# Patient Record
Sex: Female | Born: 1945 | Race: White | Hispanic: No | Marital: Single | Smoking: Never smoker
Health system: Southern US, Community
[De-identification: ages and names within clinical notes are randomized; demographics above are authoritative.]

## PROBLEM LIST (undated history)

## (undated) DIAGNOSIS — Z87442 Personal history of urinary calculi: Secondary | ICD-10-CM

## (undated) DIAGNOSIS — C50111 Malignant neoplasm of central portion of right female breast: Secondary | ICD-10-CM

## (undated) DIAGNOSIS — R51 Headache: Secondary | ICD-10-CM

## (undated) DIAGNOSIS — G8929 Other chronic pain: Secondary | ICD-10-CM

## (undated) DIAGNOSIS — I10 Essential (primary) hypertension: Secondary | ICD-10-CM

## (undated) DIAGNOSIS — R928 Other abnormal and inconclusive findings on diagnostic imaging of breast: Secondary | ICD-10-CM

## (undated) DIAGNOSIS — G43909 Migraine, unspecified, not intractable, without status migrainosus: Secondary | ICD-10-CM

## (undated) DIAGNOSIS — M1990 Unspecified osteoarthritis, unspecified site: Secondary | ICD-10-CM

## (undated) DIAGNOSIS — R519 Headache, unspecified: Secondary | ICD-10-CM

## (undated) DIAGNOSIS — K219 Gastro-esophageal reflux disease without esophagitis: Secondary | ICD-10-CM

## (undated) HISTORY — PX: CATARACT EXTRACTION W/ INTRAOCULAR LENS  IMPLANT, BILATERAL: SHX1307

---

## 2018-07-21 ENCOUNTER — Other Ambulatory Visit: Payer: Self-pay | Admitting: Family Medicine

## 2018-07-21 DIAGNOSIS — N6489 Other specified disorders of breast: Secondary | ICD-10-CM

## 2018-07-21 HISTORY — PX: BREAST BIOPSY: SHX20

## 2018-07-23 ENCOUNTER — Ambulatory Visit
Admission: RE | Admit: 2018-07-23 | Discharge: 2018-07-23 | Disposition: A | Discharge status: Home / Self Care | Payer: Medicare Other | Source: Ambulatory Visit | Attending: Family Medicine | Admitting: Family Medicine

## 2018-07-23 DIAGNOSIS — N6489 Other specified disorders of breast: Secondary | ICD-10-CM

## 2018-07-23 HISTORY — PX: BREAST BIOPSY: SHX20

## 2018-08-05 ENCOUNTER — Other Ambulatory Visit: Payer: Self-pay | Admitting: General Surgery

## 2018-08-05 DIAGNOSIS — C50111 Malignant neoplasm of central portion of right female breast: Secondary | ICD-10-CM

## 2018-08-05 DIAGNOSIS — Z170 Estrogen receptor positive status [ER+]: Principal | ICD-10-CM

## 2018-08-06 NOTE — Pre-Procedure Instructions (Addendum)
Brittany Carr  08/06/2018     No Pharmacies Listed   Your procedure is scheduled on Monday October 7th.  Report to [facility] Admitting at 830 A.M.  Call this number if you have problems the morning of surgery:  [phone]   Remember:  Do not eat or drink after midnight.    Take these medicines the morning of surgery with A SIP OF WATER  Tylenol (if needed)   Protonix   Eye Drops    7 days prior to surgery STOP taking any Aspirin(unless otherwise instructed by your surgeon), Aleve, Naproxen, Ibuprofen, Motrin, Advil, Goody's, BC's, all herbal medications, fish oil, and all vitamins    Do not wear jewelry, make-up or nail polish.  Do not wear lotions, powders, or perfumes, or deodorant.  Do not shave 48 hours prior to surgery.  Men may shave face and neck.  Do not bring valuables to the hospital.  [facility] is not responsible for any belongings or valuables.  Contacts, dentures or bridgework may not be worn into surgery.  Leave your suitcase in the car.  After surgery it may be brought to your room.  For patients admitted to the hospital, discharge time will be determined by your treatment team.  Patients discharged the day of surgery will not be allowed to drive home.    - Preparing For Surgery  Before surgery, you can play an important role. Because skin is not sterile, your skin needs to be as free of germs as possible. You can reduce the number of germs on your skin by washing with CHG (chlorahexidine gluconate) Soap before surgery.  CHG is an antiseptic cleaner which kills germs and bonds with the skin to continue killing germs even after washing.    Oral Hygiene is also important to reduce your risk of infection.  Remember - BRUSH YOUR TEETH THE MORNING OF SURGERY WITH YOUR REGULAR TOOTHPASTE  Please do not use if you have an allergy to CHG or antibacterial soaps. If your skin becomes reddened/irritated stop using the CHG.  Do not shave  (including legs and underarms) for at least 48 hours prior to first CHG shower. It is OK to shave your face.  Please follow these instructions carefully.   1. Shower the NIGHT BEFORE SURGERY and the MORNING OF SURGERY with CHG.   2. If you chose to wash your hair, wash your hair first as usual with your normal shampoo.  3. After you shampoo, rinse your hair and body thoroughly to remove the shampoo.  4. Use CHG as you would any other liquid soap. You can apply CHG directly to the skin and wash gently with a scrungie or a clean washcloth.   5. Apply the CHG Soap to your body ONLY FROM THE NECK DOWN.  Do not use on open wounds or open sores. Avoid contact with your eyes, ears, mouth and genitals (private parts). Wash Face and genitals (private parts)  with your normal soap.  6. Wash thoroughly, paying special attention to the area where your surgery will be performed.  7. Thoroughly rinse your body with warm water from the neck down.  8. DO NOT shower/wash with your normal soap after using and rinsing off the CHG Soap.  9. Pat yourself dry with a CLEAN TOWEL.  10. Wear CLEAN PAJAMAS to bed the night before surgery, wear comfortable clothes the morning of surgery  11. Place CLEAN SHEETS on your bed the night of your first  shower and DO NOT SLEEP WITH PETS.    Day of Surgery:  Do not apply any deodorants/lotions.  Please wear clean clothes to the hospital/surgery center.   Remember to brush your teeth WITH YOUR REGULAR TOOTHPASTE.    Please read over the following fact sheets that you were given.

## 2018-08-09 ENCOUNTER — Encounter (HOSPITAL_COMMUNITY)
Admission: RE | Admit: 2018-08-09 | Discharge: 2018-08-09 | Disposition: A | Discharge status: Home / Self Care | Payer: Medicare Other | Source: Ambulatory Visit | Attending: General Surgery | Admitting: General Surgery

## 2018-08-09 ENCOUNTER — Encounter (HOSPITAL_COMMUNITY): Payer: Self-pay

## 2018-08-09 ENCOUNTER — Other Ambulatory Visit: Payer: Self-pay

## 2018-08-09 DIAGNOSIS — Z01818 Encounter for other preprocedural examination: Secondary | ICD-10-CM | POA: Insufficient documentation

## 2018-08-09 DIAGNOSIS — I10 Essential (primary) hypertension: Secondary | ICD-10-CM | POA: Insufficient documentation

## 2018-08-09 HISTORY — DX: Unspecified osteoarthritis, unspecified site: M19.90

## 2018-08-09 HISTORY — DX: Personal history of urinary calculi: Z87.442

## 2018-08-09 HISTORY — DX: Gastro-esophageal reflux disease without esophagitis: K21.9

## 2018-08-09 HISTORY — DX: Essential (primary) hypertension: I10

## 2018-08-09 LAB — CBC WITH DIFFERENTIAL/PLATELET
Abs Immature Granulocytes: 0 10*3/uL (ref 0.0–0.1)
Basophils Absolute: 0 10*3/uL (ref 0.0–0.1)
Basophils Relative: 0 %
EOS ABS: 0.4 10*3/uL (ref 0.0–0.7)
Eosinophils Relative: 5 %
HEMATOCRIT: 40.7 % (ref 36.0–46.0)
HEMOGLOBIN: 13.3 g/dL (ref 12.0–15.0)
IMMATURE GRANULOCYTES: 0 %
LYMPHS ABS: 1.5 10*3/uL (ref 0.7–4.0)
LYMPHS PCT: 18 %
MCH: 31 pg (ref 26.0–34.0)
MCHC: 32.7 g/dL (ref 30.0–36.0)
MCV: 94.9 fL (ref 78.0–100.0)
Monocytes Absolute: 0.7 10*3/uL (ref 0.1–1.0)
Monocytes Relative: 8 %
NEUTROS PCT: 69 %
Neutro Abs: 5.6 10*3/uL (ref 1.7–7.7)
Platelets: 368 10*3/uL (ref 150–400)
RBC: 4.29 MIL/uL (ref 3.87–5.11)
RDW: 12.7 % (ref 11.5–15.5)
WBC: 8.2 10*3/uL (ref 4.0–10.5)

## 2018-08-09 LAB — COMPREHENSIVE METABOLIC PANEL
ALBUMIN: 3.9 g/dL (ref 3.5–5.0)
ALK PHOS: 84 U/L (ref 38–126)
ALT: 32 U/L (ref 0–44)
AST: 25 U/L (ref 15–41)
Anion gap: 8 (ref 5–15)
BILIRUBIN TOTAL: 0.5 mg/dL (ref 0.3–1.2)
BUN: 18 mg/dL (ref 8–23)
CALCIUM: 9.8 mg/dL (ref 8.9–10.3)
CO2: 25 mmol/L (ref 22–32)
CREATININE: 0.79 mg/dL (ref 0.44–1.00)
Chloride: 106 mmol/L (ref 98–111)
GFR calc Af Amer: >60 mL/min (ref >60)
GFR calc non Af Amer: >60 mL/min (ref >60)
GLUCOSE: 93 mg/dL (ref 70–99)
Potassium: 4.1 mmol/L (ref 3.5–5.1)
Sodium: 139 mmol/L (ref 135–145)
TOTAL PROTEIN: 7.5 g/dL (ref 6.5–8.1)

## 2018-08-09 NOTE — Progress Notes (Signed)
PCP - Chapman Fitch MD  EKG - 08/09/18   Aspirin Instructions: N/A  Anesthesia review: none  Patient denies shortness of breath, fever, cough and chest pain at PAT appointment   Patient verbalized understanding of instructions that were given to them at the PAT appointment. Patient was also instructed that they will need to review over the PAT instructions again at home before surgery.

## 2018-08-11 NOTE — H&P (Signed)
Brittany Carr Location: [facility] Patient #: [id number] DOB: 07/07/1946 Single / Language: Brittany Carr / Race: [place] female        History of Present Illness      This is a 72 year old female from [place]. Referred by Dr. 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 Her primary care physician is Dr. Purvis Carr.      She has no prior breast problems. She felt a lump in her right breast for at least 2 months. No nipple discharge. Imaging studies show a large mass in the right breast. Mass plus calcifications are measured as 4.3 cm. A lymph node in the right axilla was felt to be abnormal. Small density was noted in the upper outer quadrant of the left breast. 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 Biopsy of the right axillary lymph node showed no lymphoid tissue. Benign. This was felt to be discordant. A clip was left in this lymph node. Biopsy of the left breast upper outer quadrant architectural distortion shows complex grossly lesion. Excision recommended Case was discussed in breast conference yesterday. It was felt likely that she would need a mastectomy whether we did neoadjuvant chemotherapy or not.     The patient is not interested in neoadjuvant chemotherapy. She was to go ahead with right mastectomy without reconstruction, and appropriate procedures. She agrees to refer to medical oncology and radiation oncology but would like to expedite her care. She understands Port-A-Cath insertion but does not that done right now      Comorbidities include hypertension and GERD but basically healthy. Family history negative for breast or ovarian cancer. Brother died had diabetes and agent orange. Mother died with diabetes. Father died of a stroke Social history reveals that she is single. Never married. No children. Really no social  support. Denies tobacco or alcohol. Has a 32 year old niece in town. Brother lives at [place] in the [place]. Retired from [employer] and working for [employer]. She asked if she could drive herself home from the hospital following mastectomy and I told her that was not a good idea      We had a long talk. In the a.m. she wants to go ahead with right mastectomy and I think we can do that She also agrees to concurrent referral to medical oncology and radiation oncology and we have initiated that urgently.      She will be scheduled for injection blue dye right breast, right total mastectomy, targeted axillary dissection, left breast lumpectomy with radioactive seed localization I discussed the indications, details, techniques, and numerous risk of the surgery with her. She is aware the risks of bleeding, infection, cosmetic deformity, further surgery if she has multiple positive nodes on the right or if we find invasive cancer on the left. Arm swelling. Shoulder disability. May need physical therapy. Arm numbness. Skin necrosis. She understands all these issues with well. All questions were answered. She agrees with this plan   She will need case management and social work consult to help her with transportation and home health nursing.   Past Surgical History  Breast Biopsy  Bilateral. Cataract Surgery  Bilateral. Oral Surgery   Diagnostic studies Colonoscopy  never Mammogram  within last year Pap Smear  never  Allergies Sulfa Antibiotics   Medication History  Lisinopril (20MG Tablet, Oral) Active. Pantoprazole Sodium (20MG Tablet DR, Oral)  Active. Vitamin D (Cholecalciferol) (1000UNIT Capsule, Oral) Active. One-A-Day Proactive 65+ (Oral) Active. Osteo Complex (Oral) Active. Medications Reconciled  Social History  Caffeine use  Carbonated beverages, Tea. No alcohol use  No drug use  Tobacco use  Never smoker.  Jerauld Arthritis   Mother. Breast Cancer  Family Members In General. Cerebrovascular Accident  Father. Depression  Mother. Diabetes Mellitus  Brother, Father, Mother. Hypertension  Brother, Father, Mother.  Pregnancy / Birth History  Age at menarche  15 years. Age of menopause  75-55 Gravida  0 Para  0  Other Problems  Anxiety Disorder  Arthritis  Back Pain  Bladder Problems  Breast Cancer  Cholelithiasis  Chronic Obstructive Lung Disease  Diabetes Mellitus  Emphysema Of Lung  Gastroesophageal Reflux Disease  High blood pressure  Lump In Breast  Myocardial infarction    ROS General Not Present- Appetite Loss, Chills, Fatigue, Fever, Night Sweats, Weight Gain and Weight Loss. Skin Not Present- Change in Wart/Mole, Dryness, Hives, Jaundice, New Lesions, Non-Healing Wounds, Rash and Ulcer. HEENT Present- Seasonal Allergies and Wears glasses/contact lenses. Not Present- Earache, Hearing Loss, Hoarseness, Nose Bleed, Oral Ulcers, Ringing in the Ears, Sinus Pain, Sore Throat, Visual Disturbances and Yellow Eyes. Respiratory Not Present- Bloody sputum, Chronic Cough, Difficulty Breathing, Snoring and Wheezing. Breast Present- Breast Mass and Breast Pain. Not Present- Nipple Discharge and Skin Changes. Cardiovascular Not Present- Chest Pain, Difficulty Breathing Lying Down, Leg Cramps, Palpitations, Rapid Heart Rate, Shortness of Breath and Swelling of Extremities. Gastrointestinal Not Present- Abdominal Pain, Bloating, Bloody Stool, Change in Bowel Habits, Chronic diarrhea, Constipation, Difficulty Swallowing, Excessive gas, Gets full quickly at meals, Hemorrhoids, Indigestion, Nausea, Rectal Pain and Vomiting. Female Genitourinary Not Present- Frequency, Nocturia, Painful Urination, Pelvic Pain and Urgency. Musculoskeletal Present- Joint Stiffness. Not Present- Back Pain, Joint Pain, Muscle Pain, Muscle Weakness and Swelling of Extremities. Neurological Not Present- Decreased  Memory, Fainting, Headaches, Numbness, Seizures, Tingling, Tremor, Trouble walking and Weakness. Psychiatric Not Present- Anxiety, Bipolar, Change in Sleep Pattern, Depression, Fearful and Frequent crying. Endocrine Not Present- Cold Intolerance, Excessive Hunger, Hair Changes, Heat Intolerance, Hot flashes and New Diabetes. Hematology Not Present- Blood Thinners, Easy Bruising, Excessive bleeding, Gland problems, HIV and Persistent Infections.  Vitals  Weight: 174.13 lb Height: 68in Body Surface Area: 1.93 m Body Mass Index: 26.48 kg/m  Temp.: 13F(Oral)  Pulse: 66 (Regular)  BP: 160/88 (Sitting, Left Arm, Standard)     Physical Exam General Mental Status-Alert. General Appearance-Consistent with stated age. Hydration-Well hydrated. Voice-Normal.  Head and Neck Head-normocephalic, atraumatic with no lesions or palpable masses. Trachea-midline. Thyroid Gland Characteristics - normal size and consistency.  Eye Eyeball - Bilateral-Extraocular movements intact. Sclera/Conjunctiva - Bilateral-No scleral icterus.  Chest and Lung Exam Chest and lung exam reveals -quiet, even and easy respiratory effort with no use of accessory muscles and on auscultation, normal breath sounds, no adventitious sounds and normal vocal resonance. Inspection Chest Wall - Normal. Back - normal.  Breast Breast - Left-Symmetric, Non Tender, No Biopsy scars, no Dimpling, No Inflammation, No Lumpectomy scars, No Mastectomy scars, No Peau d' Orange.  Note: Right breast reveals a large central mass.    6 cm by palpation. Mobile not fixed to chest wall. Skin nipple and areola are healthy. Don't feel any axillary mass on the right. Small area of ecchymoses and maybe small hematoma left breast upper outer quadrant. No cervical or supraclavicular adenopathy.   Cardiovascular Cardiovascular examination reveals -normal heart sounds, regular rate and rhythm with no murmurs and  normal  pedal pulses bilaterally.  Abdomen Inspection Inspection of the abdomen reveals - No Hernias. Skin - Scar - no surgical scars. Palpation/Percussion Palpation and Percussion of the abdomen reveal - Soft, Non Tender, No Rebound tenderness, No Rigidity (guarding) and No hepatosplenomegaly. Auscultation Auscultation of the abdomen reveals - Bowel sounds normal.  Neurologic Neurologic evaluation reveals -alert and oriented x 3 with no impairment of recent or remote memory. Mental Status-Normal.  Musculoskeletal Normal Exam - Left-Upper Extremity Strength Normal and Lower Extremity Strength Normal. Normal Exam - Right-Upper Extremity Strength Normal and Lower Extremity Strength Normal.  Lymphatic Head & Neck  General Head & Neck Lymphatics: Bilateral - Description - Normal. Axillary  General Axillary Region: Bilateral - Description - Normal. Tenderness - Non Tender. Femoral & Inguinal  Generalized Femoral & Inguinal Lymphatics: Bilateral - Description - Normal. Tenderness - Non Tender.    Assessment & Plan  CANCER OF CENTRAL PORTION OF RIGHT BREAST (C50.111)   You recently felt a large lump in your right breast You have had several x-rays and several biopsies In the right breast centrally you have a large cancer, at least 5-6 cm by exam. Pathology shows invasive ductal carcinoma, high-grade, estrogen and progesterone receptor positive, HER-2 negative In the right axilla an abnormal lymph node was biopsied but did not reveal any lymph node tissue so this is discordant and does not correlate. This lymph node will need to be removed, and a few other lymph nodes should be removed In the left breast there was a area of distortion that was biopsied. This showed a complex sclerosing lesion. Probably this is not cancer but there is somewhere between 4 and 9% risk that there is cancer there right now. This area will need to be removed  you stated that you are not  interested in lumpectomy. you would like to proceed with right mastectomy without reconstruction. you stated that you are not sure whether you would agree to chemotherapy or not you stated that you would like to go ahead and expedite your care  Today we decided to go ahead and schedule your surgery and to refer you to medical oncology and radiation oncology here in town If, decisions were made to give radiation therapy or chemotherapy, this could probably be done in [place]  We will expedite your care   ABNORMAL MAMMOGRAM OF LEFT BREAST (R92.8) Impression: Image guided biopsy shows complex sclerosing lesion. Excision recommended HYPERTENSION, ESSENTIAL, BENIGN (I10) CHRONIC GERD (K21.9)    Elora Wolter M. Dalbert Batman, M.D., [facility] General and Minimally invasive Surgery Breast and Colorectal Surgery Office:   [phone] Pager:   [phone]

## 2018-08-12 ENCOUNTER — Ambulatory Visit
Admission: RE | Admit: 2018-08-12 | Discharge: 2018-08-12 | Disposition: A | Discharge status: Home / Self Care | Payer: Medicare Other | Source: Ambulatory Visit | Attending: General Surgery | Admitting: General Surgery

## 2018-08-12 ENCOUNTER — Other Ambulatory Visit: Payer: Self-pay | Admitting: General Surgery

## 2018-08-12 DIAGNOSIS — C50111 Malignant neoplasm of central portion of right female breast: Secondary | ICD-10-CM

## 2018-08-12 DIAGNOSIS — Z170 Estrogen receptor positive status [ER+]: Principal | ICD-10-CM

## 2018-08-16 ENCOUNTER — Ambulatory Visit (HOSPITAL_COMMUNITY)
Admission: RE | Admit: 2018-08-16 | Discharge: 2018-08-17 | Disposition: A | Discharge status: Home / Self Care | Payer: Medicare Other | Source: Ambulatory Visit | Attending: General Surgery | Admitting: General Surgery

## 2018-08-16 ENCOUNTER — Ambulatory Visit (HOSPITAL_COMMUNITY)
Admission: RE | Admit: 2018-08-16 | Discharge: 2018-08-16 | Disposition: A | Discharge status: Home / Self Care | Payer: Medicare Other | Source: Ambulatory Visit | Attending: General Surgery | Admitting: General Surgery

## 2018-08-16 ENCOUNTER — Other Ambulatory Visit: Payer: Self-pay

## 2018-08-16 ENCOUNTER — Encounter (HOSPITAL_COMMUNITY)
Admission: RE | Disposition: A | Discharge status: Home / Self Care | Payer: Self-pay | Source: Ambulatory Visit | Attending: General Surgery

## 2018-08-16 ENCOUNTER — Ambulatory Visit
Admission: RE | Admit: 2018-08-16 | Discharge: 2018-08-16 | Disposition: A | Discharge status: Home / Self Care | Payer: Medicare Other | Source: Ambulatory Visit | Attending: General Surgery | Admitting: General Surgery

## 2018-08-16 ENCOUNTER — Ambulatory Visit (HOSPITAL_COMMUNITY): Payer: Medicare Other | Admitting: Anesthesiology

## 2018-08-16 ENCOUNTER — Encounter (HOSPITAL_COMMUNITY): Payer: Self-pay

## 2018-08-16 DIAGNOSIS — Z170 Estrogen receptor positive status [ER+]: Secondary | ICD-10-CM | POA: Diagnosis not present

## 2018-08-16 DIAGNOSIS — C50111 Malignant neoplasm of central portion of right female breast: Secondary | ICD-10-CM

## 2018-08-16 DIAGNOSIS — K219 Gastro-esophageal reflux disease without esophagitis: Secondary | ICD-10-CM | POA: Insufficient documentation

## 2018-08-16 DIAGNOSIS — I10 Essential (primary) hypertension: Secondary | ICD-10-CM | POA: Diagnosis not present

## 2018-08-16 DIAGNOSIS — C773 Secondary and unspecified malignant neoplasm of axilla and upper limb lymph nodes: Secondary | ICD-10-CM | POA: Insufficient documentation

## 2018-08-16 DIAGNOSIS — Z803 Family history of malignant neoplasm of breast: Secondary | ICD-10-CM | POA: Diagnosis not present

## 2018-08-16 DIAGNOSIS — N6489 Other specified disorders of breast: Secondary | ICD-10-CM | POA: Diagnosis not present

## 2018-08-16 DIAGNOSIS — E119 Type 2 diabetes mellitus without complications: Secondary | ICD-10-CM | POA: Diagnosis not present

## 2018-08-16 DIAGNOSIS — J439 Emphysema, unspecified: Secondary | ICD-10-CM | POA: Diagnosis not present

## 2018-08-16 DIAGNOSIS — N6022 Fibroadenosis of left breast: Secondary | ICD-10-CM | POA: Diagnosis not present

## 2018-08-16 DIAGNOSIS — C50112 Malignant neoplasm of central portion of left female breast: Secondary | ICD-10-CM | POA: Diagnosis present

## 2018-08-16 DIAGNOSIS — Z79899 Other long term (current) drug therapy: Secondary | ICD-10-CM | POA: Insufficient documentation

## 2018-08-16 DIAGNOSIS — R928 Other abnormal and inconclusive findings on diagnostic imaging of breast: Secondary | ICD-10-CM

## 2018-08-16 DIAGNOSIS — I252 Old myocardial infarction: Secondary | ICD-10-CM | POA: Diagnosis not present

## 2018-08-16 HISTORY — DX: Malignant neoplasm of central portion of right female breast: C50.111

## 2018-08-16 HISTORY — PX: MASTECTOMY WITH AXILLARY LYMPH NODE DISSECTION: SHX5661

## 2018-08-16 HISTORY — DX: Other abnormal and inconclusive findings on diagnostic imaging of breast: R92.8

## 2018-08-16 HISTORY — DX: Migraine, unspecified, not intractable, without status migrainosus: G43.909

## 2018-08-16 HISTORY — DX: Other chronic pain: G89.29

## 2018-08-16 HISTORY — PX: BREAST LUMPECTOMY WITH RADIOACTIVE SEED LOCALIZATION: SHX6424

## 2018-08-16 HISTORY — PX: MASTECTOMY: SHX3

## 2018-08-16 HISTORY — DX: Headache: R51

## 2018-08-16 HISTORY — PX: BREAST LUMPECTOMY: SHX2

## 2018-08-16 HISTORY — DX: Headache, unspecified: R51.9

## 2018-08-16 LAB — CBC
HEMATOCRIT: 40.9 % (ref 36.0–46.0)
HEMOGLOBIN: 13.4 g/dL (ref 12.0–15.0)
MCH: 31 pg (ref 26.0–34.0)
MCHC: 32.8 g/dL (ref 30.0–36.0)
MCV: 94.7 fL (ref 78.0–100.0)
Platelets: 299 10*3/uL (ref 150–400)
RBC: 4.32 MIL/uL (ref 3.87–5.11)
RDW: 12.9 % (ref 11.5–15.5)
WBC: 11.3 10*3/uL — ABNORMAL HIGH (ref 4.0–10.5)

## 2018-08-16 LAB — CREATININE, SERUM
CREATININE: 0.82 mg/dL (ref 0.44–1.00)
GFR calc Af Amer: >60 mL/min (ref >60)
GFR calc non Af Amer: >60 mL/min (ref >60)

## 2018-08-16 SURGERY — MASTECTOMY WITH AXILLARY LYMPH NODE DISSECTION
Anesthesia: General | Site: Breast | Laterality: Right

## 2018-08-16 MED ORDER — PHENYLEPHRINE 40 MCG/ML (10ML) SYRINGE FOR IV PUSH (FOR BLOOD PRESSURE SUPPORT)
PREFILLED_SYRINGE | INTRAVENOUS | Status: DC | PRN
  Administered 2018-08-16: 40 ug via INTRAVENOUS
  Administered 2018-08-16 (×3): 80 ug via INTRAVENOUS

## 2018-08-16 MED ORDER — ACETAMINOPHEN 500 MG PO TABS: 1000.0000 mg | ORAL_TABLET | ORAL | Status: DC

## 2018-08-16 MED ORDER — ONDANSETRON HCL 4 MG/2ML IJ SOLN
INTRAMUSCULAR | Status: AC
  Filled 2018-08-16: qty 2

## 2018-08-16 MED ORDER — ONDANSETRON 4 MG PO TBDP: 4.0000 mg | ORAL_TABLET | Freq: Four times a day (QID) | ORAL | Status: DC | PRN

## 2018-08-16 MED ORDER — MEPERIDINE HCL 50 MG/ML IJ SOLN: 6.2500 mg | INTRAMUSCULAR | Status: DC | PRN

## 2018-08-16 MED ORDER — SODIUM CHLORIDE 0.9 % IJ SOLN
INTRAMUSCULAR | Status: AC
  Filled 2018-08-16: qty 10

## 2018-08-16 MED ORDER — PHENYLEPHRINE 40 MCG/ML (10ML) SYRINGE FOR IV PUSH (FOR BLOOD PRESSURE SUPPORT)
PREFILLED_SYRINGE | INTRAVENOUS | Status: AC
  Filled 2018-08-16: qty 10

## 2018-08-16 MED ORDER — BUPIVACAINE-EPINEPHRINE (PF) 0.25% -1:200000 IJ SOLN
INTRAMUSCULAR | Status: AC
  Filled 2018-08-16: qty 30

## 2018-08-16 MED ORDER — CALCIUM POLYCARBOPHIL 625 MG PO TABS
625.0000 mg | ORAL_TABLET | Freq: Every day | ORAL | Status: DC
  Administered 2018-08-16 – 2018-08-17 (×2): 625 mg via ORAL
  Filled 2018-08-16 (×3): qty 1

## 2018-08-16 MED ORDER — FENTANYL CITRATE (PF) 100 MCG/2ML IJ SOLN
50.0000 ug | Freq: Once | INTRAMUSCULAR | Status: AC
  Administered 2018-08-16: 50 ug via INTRAVENOUS

## 2018-08-16 MED ORDER — OSTEO BI-FLEX TRIPLE STRENGTH PO TABS: ORAL_TABLET | Freq: Every day | ORAL | Status: DC

## 2018-08-16 MED ORDER — METHOCARBAMOL 500 MG PO TABS
500.0000 mg | ORAL_TABLET | Freq: Four times a day (QID) | ORAL | Status: DC | PRN
  Administered 2018-08-16: 500 mg via ORAL
  Filled 2018-08-16: qty 1

## 2018-08-16 MED ORDER — GABAPENTIN 300 MG PO CAPS
300.0000 mg | ORAL_CAPSULE | ORAL | Status: AC
  Administered 2018-08-16: 300 mg via ORAL
  Filled 2018-08-16: qty 1

## 2018-08-16 MED ORDER — LISINOPRIL 20 MG PO TABS
20.0000 mg | ORAL_TABLET | Freq: Every day | ORAL | Status: DC
  Administered 2018-08-16 – 2018-08-17 (×2): 20 mg via ORAL
  Filled 2018-08-16 (×2): qty 1

## 2018-08-16 MED ORDER — SODIUM CHLORIDE 0.9 % IJ SOLN
INTRAVENOUS | Status: DC | PRN
  Administered 2018-08-16: 5 mL

## 2018-08-16 MED ORDER — SENNA 8.6 MG PO TABS
1.0000 | ORAL_TABLET | Freq: Two times a day (BID) | ORAL | Status: DC
  Administered 2018-08-16 – 2018-08-17 (×3): 8.6 mg via ORAL
  Filled 2018-08-16 (×3): qty 1

## 2018-08-16 MED ORDER — ONDANSETRON HCL 4 MG/2ML IJ SOLN: 4.0000 mg | Freq: Four times a day (QID) | INTRAMUSCULAR | Status: DC | PRN

## 2018-08-16 MED ORDER — OXYCODONE HCL 5 MG PO TABS: 5.0000 mg | ORAL_TABLET | Freq: Once | ORAL | Status: DC | PRN

## 2018-08-16 MED ORDER — CHLORHEXIDINE GLUCONATE CLOTH 2 % EX PADS: 6.0000 | MEDICATED_PAD | Freq: Once | CUTANEOUS | Status: DC

## 2018-08-16 MED ORDER — LACTATED RINGERS IV SOLN
INTRAVENOUS | Status: DC
  Administered 2018-08-16 – 2018-08-17 (×2): via INTRAVENOUS

## 2018-08-16 MED ORDER — CEFAZOLIN SODIUM-DEXTROSE 2-4 GM/100ML-% IV SOLN
2.0000 g | INTRAVENOUS | Status: AC
  Administered 2018-08-16: 2 g via INTRAVENOUS
  Filled 2018-08-16: qty 100

## 2018-08-16 MED ORDER — ONDANSETRON HCL 4 MG/2ML IJ SOLN
INTRAMUSCULAR | Status: DC | PRN
  Administered 2018-08-16: 4 mg via INTRAVENOUS

## 2018-08-16 MED ORDER — MIDAZOLAM HCL 5 MG/5ML IJ SOLN
INTRAMUSCULAR | Status: DC | PRN
  Administered 2018-08-16: 1 mg via INTRAVENOUS

## 2018-08-16 MED ORDER — ENOXAPARIN SODIUM 40 MG/0.4ML ~~LOC~~ SOLN
40.0000 mg | SUBCUTANEOUS | Status: DC
  Administered 2018-08-17: 40 mg via SUBCUTANEOUS
  Filled 2018-08-16 (×2): qty 0.4

## 2018-08-16 MED ORDER — PANTOPRAZOLE SODIUM 20 MG PO TBEC
20.0000 mg | DELAYED_RELEASE_TABLET | Freq: Every day | ORAL | Status: DC
  Administered 2018-08-16 – 2018-08-17 (×2): 20 mg via ORAL
  Filled 2018-08-16 (×2): qty 1

## 2018-08-16 MED ORDER — FENTANYL CITRATE (PF) 100 MCG/2ML IJ SOLN
INTRAMUSCULAR | Status: AC
  Administered 2018-08-16: 50 ug via INTRAVENOUS
  Filled 2018-08-16: qty 2

## 2018-08-16 MED ORDER — POLYVINYL ALCOHOL 1.4 % OP SOLN
1.0000 [drp] | Freq: Every day | OPHTHALMIC | Status: DC
  Administered 2018-08-17: 1 [drp] via OPHTHALMIC
  Filled 2018-08-16 (×2): qty 15

## 2018-08-16 MED ORDER — MIDAZOLAM HCL 2 MG/2ML IJ SOLN
INTRAMUSCULAR | Status: AC
  Administered 2018-08-16: 1 mg via INTRAVENOUS
  Filled 2018-08-16: qty 2

## 2018-08-16 MED ORDER — HYDROCODONE-ACETAMINOPHEN 5-325 MG PO TABS: 1.0000 | ORAL_TABLET | ORAL | Status: DC | PRN

## 2018-08-16 MED ORDER — GLYCOPYRROLATE PF 0.2 MG/ML IJ SOSY
PREFILLED_SYRINGE | INTRAMUSCULAR | Status: DC | PRN
  Administered 2018-08-16: .2 mg via INTRAVENOUS

## 2018-08-16 MED ORDER — ACETAMINOPHEN 325 MG PO TABS: 325.0000 mg | ORAL_TABLET | ORAL | Status: DC | PRN

## 2018-08-16 MED ORDER — TECHNETIUM TC 99M SULFUR COLLOID FILTERED
1.0000 | Freq: Once | INTRAVENOUS | Status: AC | PRN
  Administered 2018-08-16: 1 via INTRADERMAL

## 2018-08-16 MED ORDER — ONDANSETRON HCL 4 MG/2ML IJ SOLN: 4.0000 mg | Freq: Once | INTRAMUSCULAR | Status: DC | PRN

## 2018-08-16 MED ORDER — FENTANYL CITRATE (PF) 250 MCG/5ML IJ SOLN
INTRAMUSCULAR | Status: AC
  Filled 2018-08-16: qty 5

## 2018-08-16 MED ORDER — GLYCOPYRROLATE PF 0.2 MG/ML IJ SOSY
PREFILLED_SYRINGE | INTRAMUSCULAR | Status: AC
  Filled 2018-08-16: qty 1

## 2018-08-16 MED ORDER — FENTANYL CITRATE (PF) 100 MCG/2ML IJ SOLN
25.0000 ug | INTRAMUSCULAR | Status: DC | PRN
  Administered 2018-08-16 (×2): 25 ug via INTRAVENOUS
  Administered 2018-08-16: 50 ug via INTRAVENOUS

## 2018-08-16 MED ORDER — METHYLENE BLUE 0.5 % INJ SOLN
INTRAVENOUS | Status: AC
  Filled 2018-08-16: qty 10

## 2018-08-16 MED ORDER — GABAPENTIN 300 MG PO CAPS
300.0000 mg | ORAL_CAPSULE | Freq: Two times a day (BID) | ORAL | Status: DC
  Administered 2018-08-16 – 2018-08-17 (×3): 300 mg via ORAL
  Filled 2018-08-16 (×3): qty 1

## 2018-08-16 MED ORDER — POLYETHYL GLYCOL-PROPYL GLYCOL 0.4-0.3 % OP SOLN: 1.0000 [drp] | Freq: Every day | OPHTHALMIC | Status: DC

## 2018-08-16 MED ORDER — PROPOFOL 10 MG/ML IV BOLUS
INTRAVENOUS | Status: AC
  Filled 2018-08-16: qty 20

## 2018-08-16 MED ORDER — LIDOCAINE 2% (20 MG/ML) 5 ML SYRINGE
INTRAMUSCULAR | Status: AC
  Filled 2018-08-16: qty 5

## 2018-08-16 MED ORDER — ROPIVACAINE HCL 7.5 MG/ML IJ SOLN
INTRAMUSCULAR | Status: DC | PRN
  Administered 2018-08-16: 25 mL via PERINEURAL

## 2018-08-16 MED ORDER — OXYCODONE HCL 5 MG/5ML PO SOLN: 5.0000 mg | Freq: Once | ORAL | Status: DC | PRN

## 2018-08-16 MED ORDER — LACTATED RINGERS IV SOLN
INTRAVENOUS | Status: DC | PRN
  Administered 2018-08-16: 10:00:00 via INTRAVENOUS

## 2018-08-16 MED ORDER — FENTANYL CITRATE (PF) 100 MCG/2ML IJ SOLN
INTRAMUSCULAR | Status: DC | PRN
  Administered 2018-08-16 (×2): 50 ug via INTRAVENOUS

## 2018-08-16 MED ORDER — FENTANYL CITRATE (PF) 100 MCG/2ML IJ SOLN
INTRAMUSCULAR | Status: AC
  Filled 2018-08-16: qty 2

## 2018-08-16 MED ORDER — EPHEDRINE SULFATE-NACL 50-0.9 MG/10ML-% IV SOSY
PREFILLED_SYRINGE | INTRAVENOUS | Status: DC | PRN
  Administered 2018-08-16: 10 mg via INTRAVENOUS

## 2018-08-16 MED ORDER — CELECOXIB 200 MG PO CAPS
200.0000 mg | ORAL_CAPSULE | ORAL | Status: AC
  Administered 2018-08-16: 200 mg via ORAL
  Filled 2018-08-16: qty 1

## 2018-08-16 MED ORDER — LACTATED RINGERS IV SOLN
INTRAVENOUS | Status: DC
  Administered 2018-08-16: 09:00:00 via INTRAVENOUS

## 2018-08-16 MED ORDER — HYPROMELLOSE (GONIOSCOPIC) 2.5 % OP SOLN
1.0000 [drp] | Freq: Every day | OPHTHALMIC | Status: DC
  Filled 2018-08-16 (×2): qty 15

## 2018-08-16 MED ORDER — HYDROMORPHONE HCL 1 MG/ML IJ SOLN
1.0000 mg | INTRAMUSCULAR | Status: DC | PRN
  Administered 2018-08-16: 1 mg via INTRAVENOUS
  Filled 2018-08-16: qty 1

## 2018-08-16 MED ORDER — LIDOCAINE 2% (20 MG/ML) 5 ML SYRINGE
INTRAMUSCULAR | Status: DC | PRN
  Administered 2018-08-16: 60 mg via INTRAVENOUS

## 2018-08-16 MED ORDER — MIDAZOLAM HCL 2 MG/2ML IJ SOLN
INTRAMUSCULAR | Status: AC
  Filled 2018-08-16: qty 2

## 2018-08-16 MED ORDER — ACETAMINOPHEN 160 MG/5ML PO SOLN: 325.0000 mg | ORAL | Status: DC | PRN

## 2018-08-16 MED ORDER — EPHEDRINE 5 MG/ML INJ
INTRAVENOUS | Status: AC
  Filled 2018-08-16: qty 10

## 2018-08-16 MED ORDER — MIDAZOLAM HCL 2 MG/2ML IJ SOLN
1.0000 mg | Freq: Once | INTRAMUSCULAR | Status: AC
  Administered 2018-08-16: 1 mg via INTRAVENOUS

## 2018-08-16 MED ORDER — PROPOFOL 10 MG/ML IV BOLUS
INTRAVENOUS | Status: DC | PRN
  Administered 2018-08-16: 200 mg via INTRAVENOUS

## 2018-08-16 MED ORDER — DEXAMETHASONE SODIUM PHOSPHATE 10 MG/ML IJ SOLN
INTRAMUSCULAR | Status: DC | PRN
  Administered 2018-08-16: 5 mg via INTRAVENOUS

## 2018-08-16 MED ORDER — TRAMADOL HCL 50 MG PO TABS
50.0000 mg | ORAL_TABLET | Freq: Four times a day (QID) | ORAL | Status: DC | PRN
  Administered 2018-08-16 – 2018-08-17 (×2): 50 mg via ORAL
  Filled 2018-08-16 (×2): qty 1

## 2018-08-16 MED ORDER — DEXAMETHASONE SODIUM PHOSPHATE 10 MG/ML IJ SOLN
INTRAMUSCULAR | Status: AC
  Filled 2018-08-16: qty 1

## 2018-08-16 MED ORDER — ROCURONIUM BROMIDE 50 MG/5ML IV SOSY
PREFILLED_SYRINGE | INTRAVENOUS | Status: AC
  Filled 2018-08-16: qty 5

## 2018-08-16 MED ORDER — 0.9 % SODIUM CHLORIDE (POUR BTL) OPTIME
TOPICAL | Status: DC | PRN
  Administered 2018-08-16: 1000 mL

## 2018-08-16 MED ORDER — SODIUM CHLORIDE 0.9 % IV SOLN
INTRAVENOUS | Status: DC | PRN
  Administered 2018-08-16: 25 ug/min via INTRAVENOUS

## 2018-08-16 SURGICAL SUPPLY — 56 items
APPLIER CLIP 9.375 MED OPEN (MISCELLANEOUS) ×3
BINDER BREAST LRG (GAUZE/BANDAGES/DRESSINGS) IMPLANT
BINDER BREAST XLRG (GAUZE/BANDAGES/DRESSINGS) ×3 IMPLANT
BIOPATCH WHT 1IN DISK W/4.0 H (GAUZE/BANDAGES/DRESSINGS) ×6 IMPLANT
BLADE SURG 15 STRL LF DISP TIS (BLADE) ×2 IMPLANT
BLADE SURG 15 STRL SS (BLADE) ×1
CANISTER SUCT 3000ML PPV (MISCELLANEOUS) ×3 IMPLANT
CHLORAPREP W/TINT 26ML (MISCELLANEOUS) ×3 IMPLANT
CLIP APPLIE 9.375 MED OPEN (MISCELLANEOUS) ×2 IMPLANT
COVER PROBE W GEL 5X96 (DRAPES) ×3 IMPLANT
COVER SURGICAL LIGHT HANDLE (MISCELLANEOUS) ×3 IMPLANT
COVER WAND RF STERILE (DRAPES) ×3 IMPLANT
DERMABOND ADVANCED (GAUZE/BANDAGES/DRESSINGS) ×3
DERMABOND ADVANCED .7 DNX12 (GAUZE/BANDAGES/DRESSINGS) ×6 IMPLANT
DEVICE DUBIN SPECIMEN MAMMOGRA (MISCELLANEOUS) ×3 IMPLANT
DRAIN CHANNEL 19F RND (DRAIN) ×6 IMPLANT
DRAPE CHEST BREAST 15X10 FENES (DRAPES) ×3 IMPLANT
DRAPE LAPAROSCOPIC ABDOMINAL (DRAPES) ×3 IMPLANT
DRAPE UTILITY XL STRL (DRAPES) ×6 IMPLANT
DRSG PAD ABDOMINAL 8X10 ST (GAUZE/BANDAGES/DRESSINGS) ×6 IMPLANT
ELECT BLADE 4.0 EZ CLEAN MEGAD (MISCELLANEOUS) ×3
ELECT CAUTERY BLADE 6.4 (BLADE) ×3 IMPLANT
ELECT REM PT RETURN 9FT ADLT (ELECTROSURGICAL) ×3
ELECTRODE BLDE 4.0 EZ CLN MEGD (MISCELLANEOUS) ×2 IMPLANT
ELECTRODE REM PT RTRN 9FT ADLT (ELECTROSURGICAL) ×2 IMPLANT
EVACUATOR SILICONE 100CC (DRAIN) ×6 IMPLANT
GAUZE SPONGE 4X4 12PLY STRL (GAUZE/BANDAGES/DRESSINGS) ×3 IMPLANT
GAUZE SPONGE 4X4 12PLY STRL LF (GAUZE/BANDAGES/DRESSINGS) ×3 IMPLANT
GLOVE EUDERMIC 7 POWDERFREE (GLOVE) ×6 IMPLANT
GOWN STRL REUS W/ TWL LRG LVL3 (GOWN DISPOSABLE) ×4 IMPLANT
GOWN STRL REUS W/ TWL XL LVL3 (GOWN DISPOSABLE) ×2 IMPLANT
GOWN STRL REUS W/TWL LRG LVL3 (GOWN DISPOSABLE) ×2
GOWN STRL REUS W/TWL XL LVL3 (GOWN DISPOSABLE) ×1
ILLUMINATOR WAVEGUIDE N/F (MISCELLANEOUS) IMPLANT
KIT BASIN OR (CUSTOM PROCEDURE TRAY) ×3 IMPLANT
KIT MARKER MARGIN INK (KITS) ×6 IMPLANT
KIT TURNOVER KIT B (KITS) ×3 IMPLANT
LIGHT WAVEGUIDE WIDE FLAT (MISCELLANEOUS) ×3 IMPLANT
NEEDLE HYPO 25GX1X1/2 BEV (NEEDLE) ×3 IMPLANT
NS IRRIG 1000ML POUR BTL (IV SOLUTION) ×3 IMPLANT
PACK GENERAL/GYN (CUSTOM PROCEDURE TRAY) ×3 IMPLANT
PACK SURGICAL SETUP 50X90 (CUSTOM PROCEDURE TRAY) ×3 IMPLANT
PAD ARMBOARD 7.5X6 YLW CONV (MISCELLANEOUS) ×3 IMPLANT
PENCIL BUTTON HOLSTER BLD 10FT (ELECTRODE) ×3 IMPLANT
SPONGE LAP 4X18 RFD (DISPOSABLE) ×3 IMPLANT
SUT ETHILON 3 0 FSL (SUTURE) ×6 IMPLANT
SUT MNCRL AB 4-0 PS2 18 (SUTURE) ×6 IMPLANT
SUT SILK 2 0 PERMA HAND 18 BK (SUTURE) ×3 IMPLANT
SUT SILK 2 0 SH (SUTURE) ×6 IMPLANT
SUT VIC AB 3-0 SH 18 (SUTURE) ×3 IMPLANT
SYR BULB 3OZ (MISCELLANEOUS) ×3 IMPLANT
SYR CONTROL 10ML LL (SYRINGE) ×6 IMPLANT
TOWEL OR 17X24 6PK STRL BLUE (TOWEL DISPOSABLE) ×3 IMPLANT
TOWEL OR 17X26 10 PK STRL BLUE (TOWEL DISPOSABLE) ×3 IMPLANT
TUBE CONNECTING 12X1/4 (SUCTIONS) ×3 IMPLANT
YANKAUER SUCT BULB TIP NO VENT (SUCTIONS) IMPLANT

## 2018-08-16 NOTE — Plan of Care (Signed)
  Problem: Pain Managment: Goal: General experience of comfort will improve Outcome: Progressing   Problem: Safety: Goal: Ability to remain free from injury will improve Outcome: Progressing   Problem: Skin Integrity: Goal: Risk for impaired skin integrity will decrease Outcome: Progressing   

## 2018-08-16 NOTE — Anesthesia Preprocedure Evaluation (Addendum)
Anesthesia Evaluation  Patient identified by MRN, date of birth, ID band Patient awake    Reviewed: Allergy & Precautions, H&P , NPO status , Patient's Chart, lab work & pertinent test results, reviewed documented beta blocker date and time   Airway Mallampati: IV  TM Distance: <3 FB Neck ROM: full  Mouth opening: Limited Mouth Opening  Dental no notable dental hx. (+) Teeth Intact   Pulmonary neg pulmonary ROS,    Pulmonary exam normal breath sounds clear to auscultation       Cardiovascular Exercise Tolerance: Good hypertension, Pt. on medications negative cardio ROS   Rhythm:regular Rate:Normal     Neuro/Psych negative neurological ROS  negative psych ROS   GI/Hepatic Neg liver ROS, GERD  ,  Endo/Other  negative endocrine ROS  Renal/GU negative Renal ROS  negative genitourinary   Musculoskeletal   Abdominal   Peds  Hematology negative hematology ROS (+)   Anesthesia Other Findings ANTERIOR AIRWAY !  Reproductive/Obstetrics negative OB ROS                           Anesthesia Physical Anesthesia Plan  ASA: II  Anesthesia Plan: General   Post-op Pain Management: GA combined w/ Regional for post-op pain   Induction: Intravenous  PONV Risk Score and Plan: 3 and Ondansetron and Treatment may vary due to age or medical condition  Airway Management Planned: Oral ETT, LMA and Video Laryngoscope Planned  Additional Equipment:   Intra-op Plan:   Post-operative Plan: Extubation in OR  Informed Consent: I have reviewed the patients History and Physical, chart, labs and discussed the procedure including the risks, benefits and alternatives for the proposed anesthesia with the patient or authorized representative who has indicated his/her understanding and acceptance.   Dental Advisory Given  Plan Discussed with: CRNA, Anesthesiologist and Surgeon  Anesthesia Plan Comments: (  )        Anesthesia Quick Evaluation

## 2018-08-16 NOTE — Progress Notes (Signed)
Brittany Carr is a 72 y.o. female patient admitted from OR  With R Mastectomy and L Lumpectomy report received from [facility] Nurse Awake, alert - oriented  X 4 - no acute distress noted.  VSS - Blood pressure (!) 150/67, pulse 60, temperature 97.6 F (36.4 C), resp. rate (!) 8, height 5\' 8"  (1.727 m), weight 79.3 kg, SpO2 100 %.    IV in place, occlusive dsg intact without redness. 2 JP drains to right draining small amount of serosanguinous fluid.  Orientation to room, and floor completed.  Admission INP armband ID verified with patient/, and in place.   SR up x 2, fall assessment complete, with patient and family able to verbalize understanding of risk associated with falls, and verbalized understanding to call nsg before up out of bed.  Call light within reach, patient able to voice, and demonstrate understanding. No evidence of skin break down noted on exam.  Admission nurse notified of admission.     Will cont to eval and treat per MD orders.  Roger Kill, RN 08/16/2018 6:27 PM

## 2018-08-16 NOTE — Interval H&P Note (Signed)
History and Physical Interval Note:  08/16/2018 8:48 AM  Brittany Carr  has presented today for surgery, with the diagnosis of CENTRAL RIGHT BREAST CANCER  The various methods of treatment have been discussed with the patient and family. After consideration of risks, benefits and other options for treatment, the patient has consented to  Procedure(s): RIGHT TOTAL MASTECTOMY WITH TARGETED AXILLARY LYMPH NODE DISSECTION (Right) LEFT BREAST LUMPECTOMY WITH RADIOACTIVE SEED LOCALIZATION (Left) as a surgical intervention .  The patient's history has been reviewed, patient examined, no change in status, stable for surgery.  I have reviewed the patient's chart and labs.  Questions were answered to the patient's satisfaction.     Adin Hector

## 2018-08-16 NOTE — Progress Notes (Signed)
PHARMACIST - PHYSICIAN ORDER COMMUNICATION  CONCERNING: P&T Medication Policy on Herbal Medications  DESCRIPTION:  This patient's order for:  Osteo Biflex triple strength tabs  has been noted.  This product(s) is classified as an "herbal" or natural product. Due to a lack of definitive safety studies or FDA approval, nonstandard manufacturing practices, plus the potential risk of unknown drug-drug interactions while on inpatient medications, the Pharmacy and Therapeutics Committee does not permit the use of "herbal" or natural products of this type within [facility].   ACTION TAKEN: The pharmacy department is unable to verify this order at this time and your patient has been informed of this safety policy. Please reevaluate patient's clinical condition at discharge and address if the herbal or natural product(s) should be resumed at that time.  Brittany Carr A. Levada Dy, PharmD, [place] Pager: [phone] Please utilize Amion for appropriate phone number to reach the unit pharmacist ([place])

## 2018-08-16 NOTE — Transfer of Care (Signed)
Immediate Anesthesia Transfer of Care Note  Patient: Brittany Carr  Procedure(s) Performed: RIGHT TOTAL MASTECTOMY WITH TARGETED AXILLARY LYMPH NODE DISSECTION (Right Breast) LEFT BREAST LUMPECTOMY WITH RADIOACTIVE SEED LOCALIZATION (Left Breast)  Patient Location: PACU  Anesthesia Type:General  Level of Consciousness: awake, alert  and oriented  Airway & Oxygen Therapy: Patient Spontanous Breathing and Patient connected to nasal cannula oxygen  Post-op Assessment: Report given to RN and Post -op Vital signs reviewed and stable  Post vital signs: Reviewed and stable  Last Vitals:  Vitals Value Taken Time  BP 115/58 08/16/2018 12:57 PM  Temp    Pulse 72 08/16/2018  1:04 PM  Resp 16 08/16/2018  1:05 PM  SpO2 100 % 08/16/2018  1:04 PM  Vitals shown include unvalidated device data.  Last Pain:  Vitals:   08/16/18 0917  TempSrc:   PainSc: 1       Patients Stated Pain Goal: 2 (23/76/28 3151)  Complications: No apparent anesthesia complications

## 2018-08-16 NOTE — Plan of Care (Signed)

## 2018-08-16 NOTE — Anesthesia Postprocedure Evaluation (Signed)
Anesthesia Post Note  Patient: Dezirae Service  Procedure(s) Performed: RIGHT TOTAL MASTECTOMY WITH TARGETED AXILLARY LYMPH NODE DISSECTION (Right Breast) LEFT BREAST LUMPECTOMY WITH RADIOACTIVE SEED LOCALIZATION (Left Breast)     Patient location during evaluation: PACU Anesthesia Type: General Level of consciousness: awake and alert Pain management: pain level controlled Vital Signs Assessment: post-procedure vital signs reviewed and stable Respiratory status: spontaneous breathing, nonlabored ventilation, respiratory function stable and patient connected to nasal cannula oxygen Cardiovascular status: blood pressure returned to baseline and stable Postop Assessment: no apparent nausea or vomiting Anesthetic complications: no    Last Vitals:  Vitals:   08/16/18 1500 08/16/18 2111  BP: (!) 150/67 (!) 145/65  Pulse: 60 (!) 55  Resp:  12  Temp:  (!) 36.4 C  SpO2:  95%    Last Pain:  Vitals:   08/16/18 2111  TempSrc: Oral  PainSc:                  Jakeia Carreras

## 2018-08-16 NOTE — Op Note (Addendum)
Patient Name:           Brittany Carr   Date of Surgery:        08/16/2018  Pre op Diagnosis:      Locally advanced cancer right breast, central, overlapping sites                                       Complex sclerosing lesion left breast, upper outer quadrant  Post op Diagnosis:    Same  Procedure:                Left breast lumpectomy with radioactive seed localization                                     Inject blue dye right breast                                     Right total mastectomy                                     Targeted deep right axillary lymph node biopsy with radioactive seed localization                                      Right axillary deep sentinel lymph node mapping and biopsy  Surgeon:                     Brittany Carr, M.D., FACS  Assistant:                      OR staff  Operative Indications:   This is a 72 year old female from [place]. Referred by Dr. Purcell Carr at the [facility] for invasive cancer right breast and complex sclerosing lesion left breast. Her primary care physician is Dr. Purvis Carr.      She has no prior breast problems. She felt a lump in her right breast for at least 2 months. No nipple discharge. Imaging studies show a large mass in the right breast. Mass plus calcifications are measured as 4.3 cm. A lymph node in the right axilla was felt to be abnormal. Small density was noted in the upper outer quadrant of the left breast. 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 Biopsy of the right axillary lymph node showed no lymphoid tissue. Benign. This was felt to be discordant. A clip was left in this lymph node. Biopsy of the left breast upper outer quadrant architectural distortion shows complex grossly lesion. Excision recommended Case was discussed in breast conference yesterday. It was felt likely that she would need a mastectomy whether we did neoadjuvant  chemotherapy or not.     The patient is not interested in neoadjuvant chemotherapy. She wants to go ahead with right mastectomy without reconstruction, and appropriate procedures. She agrees to refer to medical oncology and radiation oncology but would like to expedite her care. She understands Port-A-Cath insertion but does not want  that done right now.  She did not keep her  appointments with medical oncology.      Comorbidities include hypertension and GERD but basically healthy. Family history negative for breast or ovarian cancer.  Social history reveals that she is single. Never married. No children. Her niece from [place] is here and is going to stay with her for 6 days.       She will be scheduled for injection blue dye right breast, right total mastectomy, targeted axillary dissection, left breast lumpectomy with radioactive seed localization I discussed the indications, details, techniques, and numerous risk of the surgery with her. She is aware the risks of bleeding, infection, cosmetic deformity, further surgery if she has multiple positive nodes on the right or if we find invasive cancer on the left. Arm swelling. Shoulder disability. May need physical therapy. Arm numbness. Skin necrosis. She understands all these issues with well. All questions were answered. She agrees with this plan  Operative Findings:       The left breast lumpectomy was uneventful.  The left breast lumpectomy specimen mammogram showed the radioactive seed and the original biopsy clip.      There was a 5 cm palpable mass in the center of the right breast.  This had not invaded the skin or the muscle.  Grossly this was a clean resection with primary closure.  I was able to identify and remove the targeted right axillary node.  In addition I found 4 sentinel lymph nodes which were hot and somewhat blue.  Procedure in Detail:          Right pectoral block and right radionuclide injection was  performed in the holding area.  The patient was taken to the operating room and underwent general anesthesia with LMA device.  Intravenous antibiotics were given.  Surgical timeout was performed.  Following alcohol prep I injected 5 cc of dilute methylene blue into the right breast subareolar area and massaged the breast for a few minutes.     We then prepped and draped the entire ,chest,  chest wall and axilla on both sides.  0.25% Marcaine with epinephrine was used as a local infiltration anesthetic.  Using the neoprobe I identified the radioactive signal in the left breast upper outer quadrant.  I designed a curvilinear incision.  The incision was made and the lumpectomy was performed using the neoprobe and electrocautery.  The specimen mammogram looked good as described above.  The specimen was marked with silk sutures and a 6 color ink kit to orient the pathologist.  The specimen was sent to the lab where the seed was retrieved.  The lumpectomy cavity was irrigated.  Hemostasis was excellent.  5 metal marker clips were placed in the walls of the lumpectomy cavity on the left.  The lumpectomy cavity was closed with 2 layers of interrupted 3-0 Vicryl and the skin closed with a running subcuticular 4-0 Monocryl and Dermabond.      Attention was then directed to the right breast.  Using a marking pen I designed and drew a transverse elliptical incision.  The incision was made with a knife.  Skin flaps were raised superiorly to the infraclavicular area, medially to the parasternal area, inferiorly to the anterior rectus sheath and laterally to the anterior border of the latissimus dorsi muscle.  Using the neoprobe I identified the lymph node in the right axilla that contain the I-125 seed .  This was isolated, resected, and sent to the lab after specimen radiograph confirmed the presence of the seed.  I then  dissected sentinel nodes out of the right axilla and found 4 sentinel lymph nodes and these were sent as  separate specimens.  The right breast was marked with a silk suture at its lateral margin to orient the pathologist.  The right breast was then dissected off of the pectoralis major and minor muscles and sent to the lab.  The wound was irrigated.  Hemostasis was excellent and achieved electrocautery and a few metal clips.  Two 19 French Blake drains were placed, one up across the skin flaps and one up toward the axilla.  These were brought out through separate stab wounds below the inframammary crease, sutured to the skin and connected to suction bulbs.  The mastectomy wound was closed transversely in 2 layers.  Inner layer of 3-0 Vicryl to close the subcutaneous tissue and the skin was closed with a running subcuticular 4-0 Monocryl and Dermabond.  Clean bandages and a breast binder was placed.  The patient tolerated the procedure well and was taken to PACU in stable condition.  EBL 100 cc.  Counts correct.  Complications none.    Addendum: I logged onto the [employer] and reviewed her prescription medication history    Trusten Hume M. Dalbert Carr, M.D., FACS General and Minimally Invasive Surgery Breast and Colorectal Surgery  08/16/2018 12:52 PM

## 2018-08-16 NOTE — Anesthesia Procedure Notes (Signed)
Procedure Name: LMA Insertion Date/Time: 08/16/2018 10:35 AM Performed by: Doran Clay, CRNA Pre-anesthesia Checklist: Patient identified, Emergency Drugs available, Suction available, Patient being monitored and Timeout performed Patient Re-evaluated:Patient Re-evaluated prior to induction Oxygen Delivery Method: Circle system utilized Preoxygenation: Pre-oxygenation with 100% oxygen Induction Type: IV induction Ventilation: Mask ventilation without difficulty LMA: LMA inserted LMA Size: 4.0 Number of attempts: 1 Placement Confirmation: positive ETCO2 and breath sounds checked- equal and bilateral Tube secured with: Tape Dental Injury: Teeth and Oropharynx as per pre-operative assessment

## 2018-08-16 NOTE — Progress Notes (Signed)
Spoke with Dr. Dalbert Batman regarding blood draw gave an order to draw on left arm.

## 2018-08-16 NOTE — Anesthesia Procedure Notes (Signed)
Anesthesia Regional Block: Pectoralis block   Pre-Anesthetic Checklist: ,, timeout performed, Correct Patient, Correct Site, Correct Laterality, Correct Procedure, Correct Position, site marked, Risks and benefits discussed,  Surgical consent,  Pre-op evaluation,  At surgeon's request and post-op pain management  Laterality: Right  Prep: chloraprep       Needles:  Injection technique: Single-shot  Needle Type: Echogenic Stimulator Needle     Needle Length: 5cm  Needle Gauge: 22     Additional Needles:   Procedures:, nerve stimulator,,, ultrasound used (permanent image in chart),,,,  Narrative:  Start time: 08/16/2018 9:55 AM End time: 08/16/2018 10:00 AM Injection made incrementally with aspirations every 5 mL.  Performed by: Personally  Anesthesiologist: Janeece Riggers, MD  Additional Notes: Functioning IV was confirmed and monitors were applied.  A 28mm 22ga Arrow echogenic stimulator needle was used. Sterile prep and drape,hand hygiene and sterile gloves were used. Ultrasound guidance: relevant anatomy identified, needle position confirmed, local anesthetic spread visualized around nerve(s)., vascular puncture avoided.  Image printed for medical record. Negative aspiration and negative test dose prior to incremental administration of local anesthetic. The patient tolerated the procedure well.

## 2018-08-17 ENCOUNTER — Encounter (HOSPITAL_COMMUNITY): Payer: Self-pay | Admitting: General Surgery

## 2018-08-17 DIAGNOSIS — C50111 Malignant neoplasm of central portion of right female breast: Secondary | ICD-10-CM | POA: Diagnosis not present

## 2018-08-17 NOTE — Plan of Care (Signed)

## 2018-08-17 NOTE — Discharge Instructions (Signed)
[facility] °[phone] ° °MASTECTOMY: POST OP INSTRUCTIONS ° °Always review your discharge instruction sheet given to you by the facility where your surgery was performed. °IF YOU HAVE DISABILITY OR FAMILY LEAVE FORMS, YOU MUST BRING THEM TO THE OFFICE FOR PROCESSING.   °DO NOT GIVE THEM TO YOUR DOCTOR. °A prescription for pain medication may be given to you upon discharge.  Take your pain medication as prescribed, if needed.  If narcotic pain medicine is not needed, then you may take acetaminophen (Tylenol) or ibuprofen (Advil) as needed. °1. Take your usually prescribed medications unless otherwise directed. °2. If you need a refill on your pain medication, please contact your pharmacy.  They will contact our office to request authorization.  Prescriptions will not be filled after 5pm or on week-ends. °3. You should follow a light diet the first few days after arrival home, such as soup and crackers, etc.  Resume your normal diet the day after surgery. °4. Most patients will experience some swelling and bruising on the chest and underarm.  Ice packs will help.  Swelling and bruising can take several days to resolve.  °5. It is common to experience some constipation if taking pain medication after surgery.  Increasing fluid intake and taking a stool softener (such as Colace) will usually help or prevent this problem from occurring.  A mild laxative (Milk of Magnesia or Miralax) should be taken according to package instructions if there are no bowel movements after 48 hours. °6. Unless discharge instructions indicate otherwise, leave your bandage dry and in place until your next appointment in 3-5 days.  You may take a limited sponge bath.  No tube baths or showers until the drains are removed.  You may have steri-strips (small skin tapes) in place directly over the incision.  These strips should be left on the skin for 7-10 days.  If your surgeon used skin glue on the incision, you may  shower in 24 hours.  The glue will flake off over the next 2-3 weeks.  Any sutures or staples will be removed at the office during your follow-up visit. °7. DRAINS:  If you have drains in place, it is important to keep a list of the amount of drainage produced each day in your drains.  Before leaving the hospital, you should be instructed on drain care.  Call our office if you have any questions about your drains. °8. ACTIVITIES:  You may resume regular (light) daily activities beginning the next day--such as daily self-care, walking, climbing stairs--gradually increasing activities as tolerated.  You may have sexual intercourse when it is comfortable.  Refrain from any heavy lifting or straining until approved by your doctor. °a. You may drive when you are no longer taking prescription pain medication, you can comfortably wear a seatbelt, and you can safely maneuver your car and apply brakes. °b. RETURN TO WORK:  __________________________________________________________ °9. You should see your doctor in the office for a follow-up appointment approximately 3-5 days after your surgery.  Your doctor’s nurse will typically make your follow-up appointment when she calls you with your pathology report.  Expect your pathology report 2-3 business days after your surgery.  You may call to check if you do not hear from us after three days.   °10. OTHER INSTRUCTIONS: ______________________________________________________________________________________________ ____________________________________________________________________________________________ °WHEN TO CALL YOUR DOCTOR: °1. Fever over 101.0 °2. Nausea and/or vomiting °3. Extreme swelling or bruising °4. Continued bleeding from incision. °5. Increased pain, redness, or drainage from the incision. °  The clinic staff is available to answer your questions during regular business hours.  Please dont hesitate to call and ask to speak to one of the nurses for clinical  concerns.  If you have a medical emergency, go to the nearest emergency room or call 911.  A surgeon from [facility] is always on call at the hospital. [address] [place], [place], [place]  [address] ? [address], [place], [place]   [address] [phone] ? [phone] ? FAX [phone] Web site: [email or web address]        Continue all of your usual medications without modification.  Take high-dose Tylenol for pain, 1000 mg every 6 hours as needed

## 2018-08-17 NOTE — Discharge Summary (Addendum)
Patient ID: Brittany Carr [id number] 72 y.o. [birth year]  Admit date: 08/16/2018  Discharge date and time: 08/17/2018  Admitting Physician: Adin Hector  Discharge Physician: Adin Hector  Admission Diagnoses: CENTRAL RIGHT BREAST CANCER  Discharge Diagnoses: Cancer central portion right breast                                         Complex sclerosing lesion left breast, upper outer quadrant                                         Hypertension, essential, benign  Operations: Procedure(s): RIGHT TOTAL MASTECTOMY WITH TARGETED AXILLARY LYMPH NODE DISSECTION LEFT BREAST LUMPECTOMY WITH RADIOACTIVE SEED LOCALIZATION  Admission Condition: good  Discharged Condition: good  Indication for Admission: This is a 72 year old female from [place]. Referred by Dr. Purcell Nails at the [facility] for invasive cancer right breast and complex sclerosing lesion left breast. Her primary care physician is Dr. Purvis Kilts. She has no prior breast problems. She felt a lump in her right breast for at least 2 months. No nipple discharge. Imaging studies show a large mass in the right breast. Mass plus calcifications are measured as 4.3 cm. A lymph node in the right axilla was felt to be abnormal. Small density was noted in the upper outer quadrant of the left breast. 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 Biopsy of the right axillary lymph node showed no lymphoid tissue. Benign. This was felt to be discordant. A clip was left in this lymph node. Biopsy of the left breast upper outer quadrant architectural distortion shows complex grossly lesion. Excision recommended Case was discussed in breast conference yesterday. It was felt likely that she would need a mastectomy whether we did neoadjuvant chemotherapy or not. The patient is not interested in neoadjuvant chemotherapy. She wants to go ahead with right  mastectomy without reconstruction, and appropriate procedures. She agrees to refer to medical oncology and radiation oncology but would like to expedite her care. She understands Port-A-Cath insertion but does not want  that done right now.  She did not keep her appointments with medical oncology. Comorbidities include hypertension and GERD but basically healthy. Family history negative for breast or ovarian cancer.  Social history reveals that she is single. Never married. No children. Her niece from [place] is here and is going to stay with her for 6 days.  She will be scheduled for injection blue dye right breast, right total mastectomy, targeted axillary dissection, left breast lumpectomy with radioactive seed localization I discussed the indications, details, techniques, and numerous risk of the surgery with her. She is aware the risks of bleeding, infection, cosmetic deformity, further surgery if she has multiple positive nodes on the right or if we find invasive cancer on the left. Arm swelling. Shoulder disability. May need physical therapy. Arm numbness. Skin necrosis. She understands all these issues with well. All questions were answered. She agrees with this plan  Hospital Course: On the day of admission the patient was taken to the operating room.  Left breast lumpectomy with radioactive seed localization was performed.  Right total mastectomy with targeted right axillary dissection was performed.     Operative findings were   The left breast  lumpectomy was uneventful.  The left breast lumpectomy specimen mammogram showed the radioactive seed and the original biopsy clip.      There was a 5 cm palpable mass in the center of the right breast.  This had not invaded the skin or the muscle.  Grossly this was a clean resection with primary closure.  I was able to identify and remove the targeted right axillary node.  In addition I found 4 sentinel lymph nodes which  were hot and somewhat blue.     The patient was admitted and observed overnight and did very well.  On postop day 1 she was very comfortable having no pain and stated that she would be fine taking Tylenol and declined all prescription medication.  She was ambulatory.  Voiding without difficulty.  Had tolerated a liquid diet and was to advance to regular diet for breakfast.  She wanted to go home.  Her right mastectomy wound and left lumpectomy wounds look fine.  No signs of fluid or hematoma.  On the right side both JP drains were functioning and low volume and serosanguineous.  She was moving her right shoulder around quite well.  There was a slight amount of numbness high in the upper inner right arm but no swelling.     She was given instructions in diet and activities.  She has an appointment to see me on October 21.  She knows that the pathology report will be called to her late this week.  We discussed referral to medical and radiation oncology and she wants to do that in [place], [place].  We will make those referrals when I see her in the office on October 21.  She is instructed to continue all of her usual medications without modification.    She has a niece from [place] who is here and is going to stay with her for 1 week which should be more than adequate to provide wound care and support.  Consults: None  Significant Diagnostic Studies: Surgical pathology, pending  Treatments: surgery: Right total mastectomy, targeted right axillary lymph node dissection, left breast lumpectomy  Disposition: Home  Patient Instructions:  Allergies as of 08/17/2018      Reactions   Sulfa Antibiotics Other (See Comments)   "make me feel weird"       Medication List    TAKE these medications   acetaminophen 500 MG tablet Commonly known as:  TYLENOL Take 1,000 mg by mouth every 8 (eight) hours as needed (for pain.).   FIBER-CAPS PO Take 1 capsule by mouth daily.   ibuprofen 200 MG  tablet Commonly known as:  ADVIL,MOTRIN Take 400 mg by mouth every 8 (eight) hours as needed (for pain.).   lisinopril 20 MG tablet Commonly known as:  PRINIVIL,ZESTRIL Take 20 mg by mouth daily.   multivitamin with minerals Tabs tablet Take 2 tablets by mouth daily. One-A-Day Proactive 65+   OSTEO BI-FLEX TRIPLE STRENGTH PO Take 1 tablet by mouth daily.   pantoprazole 20 MG tablet Commonly known as:  PROTONIX Take 20 mg by mouth daily.   SYSTANE ULTRA 0.4-0.3 % Soln Generic drug:  Polyethyl Glycol-Propyl Glycol Place 1 drop into both eyes daily.       Activity: Activity discussed.  Frequent ambulation.  No driving for 2 weeks.  No sports or lifting.  Shoulder range of motion exercises encouraged Diet: low fat, low cholesterol diet Wound Care: as directed  Follow-up:  With Dr. Dalbert Batman in 2 weeks.  Signed: Edsel Petrin. Dalbert Batman, M.D., FACS General and minimally invasive surgery Breast and Colorectal Surgery  08/17/2018, 6:43 AM

## 2018-08-17 NOTE — Progress Notes (Signed)
Brittany Carr to be D/C'd  per MD order. Discussed with the patient and all questions fully answered.  VSS, Skin clean, dry and intact without evidence of skin break down, no evidence of skin tears noted.  IV catheter discontinued intact. Site without signs and symptoms of complications. Dressing and pressure applied.  An After Visit Summary was printed and given to the patient. Patient received prescription.  D/c education completed with patient/family including follow up instructions, medication list, d/c activities limitations if indicated, with other d/c instructions as indicated by MD - patient able to verbalize understanding, all questions fully answered.   Patient instructed to return to ED, call 911, or call MD for any changes in condition.   Patient to be escorted via [place], and D/C home via private auto.

## 2018-09-16 ENCOUNTER — Encounter (HOSPITAL_COMMUNITY): Payer: Self-pay | Admitting: Oncology

## 2018-09-24 ENCOUNTER — Other Ambulatory Visit: Payer: Self-pay | Admitting: General Surgery

## 2018-09-28 ENCOUNTER — Encounter (HOSPITAL_BASED_OUTPATIENT_CLINIC_OR_DEPARTMENT_OTHER): Payer: Self-pay | Admitting: *Deleted

## 2018-09-28 ENCOUNTER — Other Ambulatory Visit: Payer: Self-pay

## 2018-09-29 ENCOUNTER — Encounter (HOSPITAL_BASED_OUTPATIENT_CLINIC_OR_DEPARTMENT_OTHER)
Admission: RE | Admit: 2018-09-29 | Discharge: 2018-09-29 | Disposition: A | Discharge status: Home / Self Care | Payer: Medicare Other | Source: Ambulatory Visit | Attending: General Surgery | Admitting: General Surgery

## 2018-09-29 DIAGNOSIS — Z01812 Encounter for preprocedural laboratory examination: Secondary | ICD-10-CM | POA: Insufficient documentation

## 2018-09-29 LAB — COMPREHENSIVE METABOLIC PANEL
ALK PHOS: 78 U/L (ref 38–126)
ALT: 22 U/L (ref 0–44)
ANION GAP: 7 (ref 5–15)
AST: 23 U/L (ref 15–41)
Albumin: 3.9 g/dL (ref 3.5–5.0)
BILIRUBIN TOTAL: 0.6 mg/dL (ref 0.3–1.2)
BUN: 19 mg/dL (ref 8–23)
CALCIUM: 9.6 mg/dL (ref 8.9–10.3)
CO2: 26 mmol/L (ref 22–32)
Chloride: 104 mmol/L (ref 98–111)
Creatinine, Ser: 0.78 mg/dL (ref 0.44–1.00)
GFR calc non Af Amer: >60 mL/min (ref >60)
Glucose, Bld: 95 mg/dL (ref 70–99)
Potassium: 4 mmol/L (ref 3.5–5.1)
Sodium: 137 mmol/L (ref 135–145)
TOTAL PROTEIN: 7.3 g/dL (ref 6.5–8.1)

## 2018-09-29 LAB — CBC WITH DIFFERENTIAL/PLATELET
Abs Immature Granulocytes: 0.03 10*3/uL (ref 0.00–0.07)
Basophils Absolute: 0 10*3/uL (ref 0.0–0.1)
Basophils Relative: 1 %
EOS PCT: 3 %
Eosinophils Absolute: 0.2 10*3/uL (ref 0.0–0.5)
HEMATOCRIT: 40.3 % (ref 36.0–46.0)
HEMOGLOBIN: 13.4 g/dL (ref 12.0–15.0)
Immature Granulocytes: 0 %
LYMPHS ABS: 1.8 10*3/uL (ref 0.7–4.0)
LYMPHS PCT: 21 %
MCH: 30.7 pg (ref 26.0–34.0)
MCHC: 33.3 g/dL (ref 30.0–36.0)
MCV: 92.4 fL (ref 80.0–100.0)
Monocytes Absolute: 0.8 10*3/uL (ref 0.1–1.0)
Monocytes Relative: 9 %
Neutro Abs: 5.6 10*3/uL (ref 1.7–7.7)
Neutrophils Relative %: 66 %
Platelets: 320 10*3/uL (ref 150–400)
RBC: 4.36 MIL/uL (ref 3.87–5.11)
RDW: 12.6 % (ref 11.5–15.5)
WBC: 8.4 10*3/uL (ref 4.0–10.5)
nRBC: 0 % (ref 0.0–0.2)

## 2018-09-29 NOTE — Progress Notes (Signed)
Ensure pre surgery drink given with instructions to complete by [place], pt verbalized understanding. Reminded pt to bring overnight bag and all medications, pt verbalized understanding.

## 2018-10-02 NOTE — H&P (Signed)
Brittany Carr Location: [facility] Patient #: [id number] DOB: 1946-01-27 Single / Language: [name] / Race: White Female      History of Present Illness  The patient is a 72 year old female who presents with breast cancer. This is a pleasant 72 year old female who returns for another postop visit and is also getting ready to have a Port-A-Cath inserted this coming Monday. She lives in [place]. Her PCP is Dr. Lorenda Ishihara. Her oncologist is Dr. Leretha Dykes. . She has also seen radiation oncology in [place] at [facility].  On August 16, 2018 she underwent bilateral breast surgery. 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 One out of 6 lymph nodes positive. ER 100%. PR 60%. HER-2 negative. Ki-67 30%. Drains were removed on October 21 and she has had no problems with the wound.  We discussed management of her axilla in our breast conference and this was controversial as to whether to complete axillary A dissection or not. No consensus was reached. The cancer doctors in evening performed Oncotype 25 WITH A RECURRENT SCORE OF 20%. Recommendation from [facility] cancer center was chemotherapy and Port-A-Cath insertion. She is scheduled for Port-A-Cath insertion on Monday, November 25. She lives alone and does not have anyone that can drive her home or stay with her. Therefore she will [facility] and [facility] herself home the next day  Exam today was excellent. She does not need any physical therapy and we simply gave her an exercise sheet I discussed the indications, details, techniques and numerous risk for Port-A-Cath insertion. She is aware the risks of bleeding,  infection, pneumothorax with hospitalization, bilateral attempts, malfunction requiring revision in the future. She knows we will remove this later once finished with her chemotherapy she understands all these issues. She agrees with this plan She does she will eventually be offered radiation therapy as well  After Port-A-Cath is inserted she will proceed with chemotherapy to start on December 5 After chemotherapy she will receive radiation therapy Antiestrogen therapy will be considered. I will see her back in 5 months  Left breast mammogram in 1 year      Allergies  Sulfa Antibiotics  Allergies Reconciled   Medication History  Lisinopril ('20MG'$  Tablet, Oral) Active. Pantoprazole Sodium ('20MG'$  Tablet DR, Oral) Active. Vitamin D (Cholecalciferol) (1000UNIT Capsule, Oral) Active. One-A-Day Proactive 65+ (Oral) Active. Osteo Complex (Oral) Active. Medications Reconciled  Vitals  Weight: 172 lb Height: 68in Body Surface Area: 1.92 m Body Mass Index: 26.15 kg/m  Temp.: 98.70F(Oral)  Pulse: 65 (Regular)  BP: 142/88 (Sitting, Left Arm, Standard)     Physical Exam  General Mental Status-Alert. General Appearance-Not in acute distress. Build & Nutrition-Well nourished. Posture-Normal posture. Gait-Normal.  Head and Neck Head-normocephalic, atraumatic with no lesions or palpable masses. Trachea-midline. Thyroid Gland Characteristics - normal size and consistency and no palpable nodules.  Chest and Lung Exam Chest and lung exam reveals -on auscultation, normal breath sounds, no adventitious sounds and normal vocal resonance. Note: No deformity of either clavicle.   Breast Note: Right mastectomy incision looks good. No fluid collections and the skin flaps or axilla. Full range of motion right shoulder. No sensory deficit in on. No arm swelling.   Cardiovascular Cardiovascular examination  reveals -normal heart sounds, regular  rate and rhythm with no murmurs and femoral artery auscultation bilaterally reveals normal pulses, no bruits, no thrills.  Abdomen Inspection Inspection of the abdomen reveals - No Hernias. Palpation/Percussion Palpation and Percussion of the abdomen reveal - Soft, Non Tender, No Rigidity (guarding), No hepatosplenomegaly and No Palpable abdominal masses.  Neurologic Neurologic evaluation reveals -alert and oriented x 3 with no impairment of recent or remote memory, normal attention span and ability to concentrate, normal sensation and normal coordination.  Musculoskeletal Normal Exam - Bilateral-Upper Extremity Strength Normal and Lower Extremity Strength Normal.    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  You are recovering from your right mastectomy without any obvious surgical complications There is no sign of any fluid collection or infection You're moving your shoulder around very well Your oncologist has advised chemotherapy and I agree You are scheduled for Port-A-Cath insertion with ultrasound on Monday, November 25 I discussed the indications, techniques, and risk of the surgery in detail  Since you have no one to drive you home and no one to stay with you overnight, [facility]  We will see you on Monday to perform the Port-A-Cath insertion Otherwise I will see you back in 5 months  CHRONIC GERD (K21.9)    Sahith Nurse M. Dalbert Batman, M.D., [facility] General and Minimally invasive Surgery Breast and Colorectal Surgery Office:   [phone] Pager:   [phone]

## 2018-10-04 ENCOUNTER — Ambulatory Visit (HOSPITAL_BASED_OUTPATIENT_CLINIC_OR_DEPARTMENT_OTHER)
Admission: RE | Admit: 2018-10-04 | Discharge: 2018-10-05 | Disposition: A | Discharge status: Home / Self Care | Payer: Medicare Other | Source: Ambulatory Visit | Attending: General Surgery | Admitting: General Surgery

## 2018-10-04 ENCOUNTER — Encounter (HOSPITAL_BASED_OUTPATIENT_CLINIC_OR_DEPARTMENT_OTHER): Payer: Self-pay

## 2018-10-04 ENCOUNTER — Encounter (HOSPITAL_BASED_OUTPATIENT_CLINIC_OR_DEPARTMENT_OTHER)
Admission: RE | Disposition: A | Discharge status: Home / Self Care | Payer: Self-pay | Source: Ambulatory Visit | Attending: General Surgery

## 2018-10-04 ENCOUNTER — Ambulatory Visit (HOSPITAL_COMMUNITY): Payer: Medicare Other

## 2018-10-04 ENCOUNTER — Other Ambulatory Visit: Payer: Self-pay

## 2018-10-04 ENCOUNTER — Ambulatory Visit (HOSPITAL_BASED_OUTPATIENT_CLINIC_OR_DEPARTMENT_OTHER): Payer: Medicare Other | Admitting: Anesthesiology

## 2018-10-04 DIAGNOSIS — Z882 Allergy status to sulfonamides status: Secondary | ICD-10-CM | POA: Insufficient documentation

## 2018-10-04 DIAGNOSIS — C50111 Malignant neoplasm of central portion of right female breast: Secondary | ICD-10-CM | POA: Insufficient documentation

## 2018-10-04 DIAGNOSIS — Z9011 Acquired absence of right breast and nipple: Secondary | ICD-10-CM | POA: Insufficient documentation

## 2018-10-04 DIAGNOSIS — Z79899 Other long term (current) drug therapy: Secondary | ICD-10-CM | POA: Insufficient documentation

## 2018-10-04 DIAGNOSIS — I10 Essential (primary) hypertension: Secondary | ICD-10-CM | POA: Insufficient documentation

## 2018-10-04 DIAGNOSIS — Z791 Long term (current) use of non-steroidal anti-inflammatories (NSAID): Secondary | ICD-10-CM | POA: Diagnosis not present

## 2018-10-04 DIAGNOSIS — Z9012 Acquired absence of left breast and nipple: Secondary | ICD-10-CM | POA: Insufficient documentation

## 2018-10-04 DIAGNOSIS — Z95828 Presence of other vascular implants and grafts: Secondary | ICD-10-CM

## 2018-10-04 DIAGNOSIS — K219 Gastro-esophageal reflux disease without esophagitis: Secondary | ICD-10-CM | POA: Diagnosis not present

## 2018-10-04 DIAGNOSIS — C50912 Malignant neoplasm of unspecified site of left female breast: Secondary | ICD-10-CM | POA: Insufficient documentation

## 2018-10-04 HISTORY — PX: PORTACATH PLACEMENT: SHX2246

## 2018-10-04 SURGERY — INSERTION, TUNNELED CENTRAL VENOUS DEVICE, WITH PORT
Anesthesia: General | Site: Chest | Laterality: Right

## 2018-10-04 MED ORDER — DEXAMETHASONE SODIUM PHOSPHATE 10 MG/ML IJ SOLN
INTRAMUSCULAR | Status: AC
  Filled 2018-10-04: qty 1

## 2018-10-04 MED ORDER — EPHEDRINE SULFATE 50 MG/ML IJ SOLN
INTRAMUSCULAR | Status: DC | PRN
  Administered 2018-10-04: 15 mg via INTRAVENOUS

## 2018-10-04 MED ORDER — GABAPENTIN 300 MG PO CAPS
300.0000 mg | ORAL_CAPSULE | ORAL | Status: AC
  Administered 2018-10-04: 300 mg via ORAL

## 2018-10-04 MED ORDER — ACETAMINOPHEN 500 MG PO TABS
ORAL_TABLET | ORAL | Status: AC
  Filled 2018-10-04: qty 2

## 2018-10-04 MED ORDER — GABAPENTIN 300 MG PO CAPS
ORAL_CAPSULE | ORAL | Status: AC
  Filled 2018-10-04: qty 1

## 2018-10-04 MED ORDER — MEPERIDINE HCL 25 MG/ML IJ SOLN: 6.2500 mg | INTRAMUSCULAR | Status: DC | PRN

## 2018-10-04 MED ORDER — PHENYLEPHRINE 40 MCG/ML (10ML) SYRINGE FOR IV PUSH (FOR BLOOD PRESSURE SUPPORT)
PREFILLED_SYRINGE | INTRAVENOUS | Status: AC
  Filled 2018-10-04: qty 10

## 2018-10-04 MED ORDER — FENTANYL CITRATE (PF) 100 MCG/2ML IJ SOLN
INTRAMUSCULAR | Status: AC
  Filled 2018-10-04: qty 2

## 2018-10-04 MED ORDER — BUPIVACAINE-EPINEPHRINE (PF) 0.25% -1:200000 IJ SOLN
INTRAMUSCULAR | Status: DC | PRN
  Administered 2018-10-04: 18 mL

## 2018-10-04 MED ORDER — DEXAMETHASONE SODIUM PHOSPHATE 4 MG/ML IJ SOLN
INTRAMUSCULAR | Status: DC | PRN
  Administered 2018-10-04: 8 mg via INTRAVENOUS

## 2018-10-04 MED ORDER — CELECOXIB 200 MG PO CAPS
200.0000 mg | ORAL_CAPSULE | ORAL | Status: AC
  Administered 2018-10-04: 200 mg via ORAL

## 2018-10-04 MED ORDER — ACETAMINOPHEN 500 MG PO TABS
1000.0000 mg | ORAL_TABLET | Freq: Four times a day (QID) | ORAL | Status: DC
  Administered 2018-10-04 – 2018-10-05 (×2): 1000 mg via ORAL
  Filled 2018-10-04 (×2): qty 2

## 2018-10-04 MED ORDER — HYDROCODONE-ACETAMINOPHEN 5-325 MG PO TABS: 1.0000 | ORAL_TABLET | Freq: Four times a day (QID) | ORAL | 0 refills | Status: DC | PRN

## 2018-10-04 MED ORDER — HEPARIN (PORCINE) IN NACL 2-0.9 UNITS/ML
INTRAMUSCULAR | Status: AC | PRN
  Administered 2018-10-04: 1

## 2018-10-04 MED ORDER — PROPOFOL 10 MG/ML IV BOLUS
INTRAVENOUS | Status: DC | PRN
  Administered 2018-10-04: 150 mg via INTRAVENOUS

## 2018-10-04 MED ORDER — SODIUM CHLORIDE 0.9% FLUSH: 3.0000 mL | INTRAVENOUS | Status: DC | PRN

## 2018-10-04 MED ORDER — SODIUM CHLORIDE 0.9% FLUSH
3.0000 mL | Freq: Two times a day (BID) | INTRAVENOUS | Status: DC
  Administered 2018-10-04: 3 mL via INTRAVENOUS

## 2018-10-04 MED ORDER — ONDANSETRON HCL 4 MG/2ML IJ SOLN
INTRAMUSCULAR | Status: DC | PRN
  Administered 2018-10-04: 4 mg via INTRAVENOUS

## 2018-10-04 MED ORDER — MIDAZOLAM HCL 2 MG/2ML IJ SOLN
INTRAMUSCULAR | Status: AC
  Filled 2018-10-04: qty 2

## 2018-10-04 MED ORDER — ACETAMINOPHEN 325 MG PO TABS: 650.0000 mg | ORAL_TABLET | ORAL | Status: DC | PRN

## 2018-10-04 MED ORDER — HEPARIN SOD (PORK) LOCK FLUSH 100 UNIT/ML IV SOLN
INTRAVENOUS | Status: DC | PRN
  Administered 2018-10-04: 500 [IU]

## 2018-10-04 MED ORDER — ACETAMINOPHEN 650 MG RE SUPP: 650.0000 mg | RECTAL | Status: DC | PRN

## 2018-10-04 MED ORDER — CELECOXIB 200 MG PO CAPS
ORAL_CAPSULE | ORAL | Status: AC
  Filled 2018-10-04: qty 1

## 2018-10-04 MED ORDER — LIDOCAINE 2% (20 MG/ML) 5 ML SYRINGE
INTRAMUSCULAR | Status: AC
  Filled 2018-10-04: qty 5

## 2018-10-04 MED ORDER — CHLORHEXIDINE GLUCONATE CLOTH 2 % EX PADS: 6.0000 | MEDICATED_PAD | Freq: Once | CUTANEOUS | Status: DC

## 2018-10-04 MED ORDER — FENTANYL CITRATE (PF) 100 MCG/2ML IJ SOLN: 25.0000 ug | INTRAMUSCULAR | Status: DC | PRN

## 2018-10-04 MED ORDER — ACETAMINOPHEN 500 MG PO TABS
1000.0000 mg | ORAL_TABLET | ORAL | Status: AC
  Administered 2018-10-04: 1000 mg via ORAL

## 2018-10-04 MED ORDER — LACTATED RINGERS IV SOLN
INTRAVENOUS | Status: DC
  Administered 2018-10-04 (×2): via INTRAVENOUS

## 2018-10-04 MED ORDER — LACTATED RINGERS IV SOLN: INTRAVENOUS | Status: DC

## 2018-10-04 MED ORDER — ONDANSETRON HCL 4 MG/2ML IJ SOLN: 4.0000 mg | Freq: Once | INTRAMUSCULAR | Status: DC | PRN

## 2018-10-04 MED ORDER — EPHEDRINE 5 MG/ML INJ
INTRAVENOUS | Status: AC
  Filled 2018-10-04: qty 10

## 2018-10-04 MED ORDER — FENTANYL CITRATE (PF) 100 MCG/2ML IJ SOLN
50.0000 ug | INTRAMUSCULAR | Status: DC | PRN
  Administered 2018-10-04 (×2): 50 ug via INTRAVENOUS

## 2018-10-04 MED ORDER — MIDAZOLAM HCL 2 MG/2ML IJ SOLN
1.0000 mg | INTRAMUSCULAR | Status: DC | PRN
  Administered 2018-10-04: 1 mg via INTRAVENOUS

## 2018-10-04 MED ORDER — ONDANSETRON HCL 4 MG/2ML IJ SOLN
INTRAMUSCULAR | Status: AC
  Filled 2018-10-04: qty 2

## 2018-10-04 MED ORDER — GLYCOPYRROLATE PF 0.2 MG/ML IJ SOSY
PREFILLED_SYRINGE | INTRAMUSCULAR | Status: AC
  Filled 2018-10-04: qty 1

## 2018-10-04 MED ORDER — SUCCINYLCHOLINE CHLORIDE 200 MG/10ML IV SOSY
PREFILLED_SYRINGE | INTRAVENOUS | Status: AC
  Filled 2018-10-04: qty 10

## 2018-10-04 MED ORDER — SODIUM CHLORIDE 0.9 % IV SOLN: 250.0000 mL | INTRAVENOUS | Status: DC | PRN

## 2018-10-04 MED ORDER — HYDROMORPHONE HCL 1 MG/ML IJ SOLN: 0.2500 mg | INTRAMUSCULAR | Status: DC | PRN

## 2018-10-04 MED ORDER — SCOPOLAMINE 1 MG/3DAYS TD PT72: 1.0000 | MEDICATED_PATCH | Freq: Once | TRANSDERMAL | Status: DC | PRN

## 2018-10-04 MED ORDER — CEFAZOLIN SODIUM-DEXTROSE 2-4 GM/100ML-% IV SOLN
2.0000 g | INTRAVENOUS | Status: AC
  Administered 2018-10-04: 2 g via INTRAVENOUS

## 2018-10-04 MED ORDER — OXYCODONE HCL 5 MG PO TABS
5.0000 mg | ORAL_TABLET | ORAL | Status: DC | PRN
  Administered 2018-10-04: 5 mg via ORAL
  Filled 2018-10-04: qty 1

## 2018-10-04 MED ORDER — CEFAZOLIN SODIUM-DEXTROSE 2-4 GM/100ML-% IV SOLN
INTRAVENOUS | Status: AC
  Filled 2018-10-04: qty 100

## 2018-10-04 SURGICAL SUPPLY — 55 items
BAG DECANTER FOR FLEXI CONT (MISCELLANEOUS) ×2 IMPLANT
BENZOIN TINCTURE PRP APPL 2/3 (GAUZE/BANDAGES/DRESSINGS) IMPLANT
BLADE HEX COATED 2.75 (ELECTRODE) ×2 IMPLANT
BLADE SURG 15 STRL LF DISP TIS (BLADE) ×1 IMPLANT
BLADE SURG 15 STRL SS (BLADE) ×1
CANISTER SUCT 1200ML W/VALVE (MISCELLANEOUS) IMPLANT
CHLORAPREP W/TINT 26ML (MISCELLANEOUS) ×2 IMPLANT
COVER BACK TABLE 60X90IN (DRAPES) ×2 IMPLANT
COVER MAYO STAND STRL (DRAPES) ×2 IMPLANT
COVER PROBE 5X48 (MISCELLANEOUS) ×1
COVER WAND RF STERILE (DRAPES) IMPLANT
DECANTER SPIKE VIAL GLASS SM (MISCELLANEOUS) IMPLANT
DERMABOND ADVANCED (GAUZE/BANDAGES/DRESSINGS) ×1
DERMABOND ADVANCED .7 DNX12 (GAUZE/BANDAGES/DRESSINGS) ×1 IMPLANT
DRAPE C-ARM 42X72 X-RAY (DRAPES) ×2 IMPLANT
DRAPE LAPAROSCOPIC ABDOMINAL (DRAPES) ×2 IMPLANT
DRAPE UTILITY XL STRL (DRAPES) ×2 IMPLANT
DRSG TEGADERM 2-3/8X2-3/4 SM (GAUZE/BANDAGES/DRESSINGS) IMPLANT
DRSG TEGADERM 4X10 (GAUZE/BANDAGES/DRESSINGS) IMPLANT
DRSG TEGADERM 4X4.75 (GAUZE/BANDAGES/DRESSINGS) IMPLANT
ELECT REM PT RETURN 9FT ADLT (ELECTROSURGICAL) ×2
ELECTRODE REM PT RTRN 9FT ADLT (ELECTROSURGICAL) ×1 IMPLANT
GAUZE SPONGE 4X4 12PLY STRL LF (GAUZE/BANDAGES/DRESSINGS) IMPLANT
GLOVE BIO SURGEON STRL SZ7 (GLOVE) ×2 IMPLANT
GLOVE BIOGEL PI IND STRL 7.0 (GLOVE) ×1 IMPLANT
GLOVE BIOGEL PI IND STRL 7.5 (GLOVE) ×1 IMPLANT
GLOVE BIOGEL PI INDICATOR 7.0 (GLOVE) ×1
GLOVE BIOGEL PI INDICATOR 7.5 (GLOVE) ×1
GLOVE EUDERMIC 7 POWDERFREE (GLOVE) ×2 IMPLANT
GOWN STRL REUS W/ TWL LRG LVL3 (GOWN DISPOSABLE) ×1 IMPLANT
GOWN STRL REUS W/ TWL XL LVL3 (GOWN DISPOSABLE) ×1 IMPLANT
GOWN STRL REUS W/TWL LRG LVL3 (GOWN DISPOSABLE) ×1
GOWN STRL REUS W/TWL XL LVL3 (GOWN DISPOSABLE) ×1
IV CATH PLACEMENT UNIT 16 GA (IV SOLUTION) IMPLANT
IV KIT MINILOC 20X1 SAFETY (NEEDLE) IMPLANT
KIT CVR 48X5XPRB PLUP LF (MISCELLANEOUS) ×1 IMPLANT
KIT PORT POWER 8FR ISP CVUE (Port) ×2 IMPLANT
NEEDLE BLUNT 17GA (NEEDLE) IMPLANT
NEEDLE HYPO 22GX1.5 SAFETY (NEEDLE) ×2 IMPLANT
NEEDLE HYPO 25X1 1.5 SAFETY (NEEDLE) ×2 IMPLANT
PACK BASIN DAY SURGERY FS (CUSTOM PROCEDURE TRAY) ×2 IMPLANT
PENCIL BUTTON HOLSTER BLD 10FT (ELECTRODE) ×2 IMPLANT
SET SHEATH INTRODUCER 10FR (MISCELLANEOUS) IMPLANT
SHEATH COOK PEEL AWAY SET 9F (SHEATH) IMPLANT
SLEEVE SCD COMPRESS KNEE MED (MISCELLANEOUS) ×2 IMPLANT
STRIP CLOSURE SKIN 1/2X4 (GAUZE/BANDAGES/DRESSINGS) IMPLANT
SUT MNCRL AB 4-0 PS2 18 (SUTURE) ×2 IMPLANT
SUT PROLENE 2 0 CT2 30 (SUTURE) ×2 IMPLANT
SUT VICRYL 3-0 CR8 SH (SUTURE) ×2 IMPLANT
SYR 10ML LL (SYRINGE) ×2 IMPLANT
SYR 5ML LUER SLIP (SYRINGE) ×2 IMPLANT
TOWEL GREEN STERILE FF (TOWEL DISPOSABLE) ×4 IMPLANT
TOWEL OR NON WOVEN STRL DISP B (DISPOSABLE) ×2 IMPLANT
TUBE CONNECTING 20X1/4 (TUBING) IMPLANT
YANKAUER SUCT BULB TIP NO VENT (SUCTIONS) IMPLANT

## 2018-10-04 NOTE — Anesthesia Preprocedure Evaluation (Signed)
Anesthesia Evaluation  Patient identified by MRN, date of birth, ID band Patient awake    Reviewed: Allergy & Precautions, NPO status , Patient's Chart, lab work & pertinent test results  Airway Mallampati: I  TM Distance: >3 FB Neck ROM: Full    Dental   Pulmonary    Pulmonary exam normal        Cardiovascular hypertension, Pt. on medications Normal cardiovascular exam     Neuro/Psych    GI/Hepatic GERD  Medicated and Controlled,  Endo/Other    Renal/GU      Musculoskeletal   Abdominal   Peds  Hematology   Anesthesia Other Findings   Reproductive/Obstetrics                             Anesthesia Physical Anesthesia Plan  ASA: II  Anesthesia Plan: General   Post-op Pain Management:    Induction:   PONV Risk Score and Plan: 3 and Ondansetron, Dexamethasone and Midazolam  Airway Management Planned: LMA  Additional Equipment:   Intra-op Plan:   Post-operative Plan: Extubation in OR  Informed Consent: I have reviewed the patients History and Physical, chart, labs and discussed the procedure including the risks, benefits and alternatives for the proposed anesthesia with the patient or authorized representative who has indicated his/her understanding and acceptance.     Plan Discussed with: CRNA and Surgeon  Anesthesia Plan Comments:         Anesthesia Quick Evaluation

## 2018-10-04 NOTE — Anesthesia Procedure Notes (Signed)
Procedure Name: LMA Insertion Date/Time: 10/04/2018 1:17 PM Performed by: Willa Frater, CRNA Pre-anesthesia Checklist: Patient identified, Emergency Drugs available, Suction available and Patient being monitored Patient Re-evaluated:Patient Re-evaluated prior to induction Oxygen Delivery Method: Circle system utilized Preoxygenation: Pre-oxygenation with 100% oxygen Induction Type: IV induction Ventilation: Mask ventilation without difficulty LMA: LMA inserted LMA Size: 4.0 Number of attempts: 1 Airway Equipment and Method: Bite block Placement Confirmation: positive ETCO2 Tube secured with: Tape Dental Injury: Teeth and Oropharynx as per pre-operative assessment

## 2018-10-04 NOTE — Interval H&P Note (Signed)
History and Physical Interval Note:  10/04/2018 12:42 PM  Brittany Carr  has presented today for surgery, with the diagnosis of Right breast cancer  The various methods of treatment have been discussed with the patient and family. After consideration of risks, benefits and other options for treatment, the patient has consented to  Procedure(s): INSERTION PORT-A-CATH WITH ULTRASOUND (N/A) as a surgical intervention .  The patient's history has been reviewed, patient examined, no change in status, stable for surgery.  I have reviewed the patient's chart and labs.  Questions were answered to the patient's satisfaction.     Adin Hector

## 2018-10-04 NOTE — Transfer of Care (Signed)
Immediate Anesthesia Transfer of Care Note  Patient: Brittany Carr  Procedure(s) Performed: INSERTION PORT-A-CATH (Right Chest)  Patient Location: PACU  Anesthesia Type:General  Level of Consciousness: sedated  Airway & Oxygen Therapy: Patient Spontanous Breathing and Patient connected to face mask oxygen  Post-op Assessment: Report given to RN and Post -op Vital signs reviewed and stable  Post vital signs: Reviewed and stable  Last Vitals:  Vitals Value Taken Time  BP 144/58 10/04/2018  2:02 PM  Temp    Pulse 80 10/04/2018  2:03 PM  Resp 13 10/04/2018  2:03 PM  SpO2 99 % 10/04/2018  2:03 PM  Vitals shown include unvalidated device data.  Last Pain:  Vitals:   10/04/18 1236  TempSrc: Oral  PainSc: 0-No pain         Complications: No apparent anesthesia complications

## 2018-10-04 NOTE — Discharge Instructions (Signed)
    PORT-A-CATH: POST OP INSTRUCTIONS  Always review your discharge instruction sheet given to you by the facility where your surgery was performed.   1. A prescription for pain medication may be given to you upon discharge. Take your pain medication as prescribed, if needed. If narcotic pain medicine is not needed, then you make take acetaminophen (Tylenol) or ibuprofen (Advil) as needed.  2. Take your usually prescribed medications unless otherwise directed. 3. If you need a refill on your pain medication, please contact our office. All narcotic pain medicine now requires a paper prescription.  Phoned in and fax refills are no longer allowed by law.  Prescriptions will not be filled after 5 pm or on weekends.  4. You should follow a light diet for the remainder of the day after your procedure. 5. Most patients will experience some mild swelling and/or bruising in the area of the incision. It may take several days to resolve. 6. It is common to experience some constipation if taking pain medication after surgery. Increasing fluid intake and taking a stool softener (such as Colace) will usually help or prevent this problem from occurring. A mild laxative (Milk of Magnesia or Miralax) should be taken according to package directions if there are no bowel movements after 48 hours.  7. Unless discharge instructions indicate otherwise, you may remove your bandages 48 hours after surgery, and you may shower at that time. You may have steri-strips (small white skin tapes) in place directly over the incision.  These strips should be left on the skin for 7-10 days.  If your surgeon used Dermabond (skin glue) on the incision, you may shower in 24 hours.  The glue will flake off over the next 2-3 weeks.  8. If your port is left accessed at the end of surgery (needle left in port), the dressing cannot get wet and should only by changed by a healthcare professional. When the port is no longer accessed (when the  needle has been removed), follow step 7.   9. ACTIVITIES:  Limit activity involving your arms for the next 72 hours. Do no strenuous exercise or activity for 1 week. You may drive when you are no longer taking prescription pain medication, you can comfortably wear a seatbelt, and you can maneuver your car. 10.You may need to see your doctor in the office for a follow-up appointment.  Please       check with your doctor.  11.When you receive a new Port-a-Cath, you will get a product guide and        ID card.  Please keep them in case you need them.  WHEN TO CALL YOUR DOCTOR ([phone]): 1. Fever over 101.0 2. Chills 3. Continued bleeding from incision 4. Increased redness and tenderness at the site 5. Shortness of breath, difficulty breathing   The clinic staff is available to answer your questions during regular business hours. Please don't hesitate to call and ask to speak to one of the nurses or medical assistants for clinical concerns. If you have a medical emergency, go to the nearest emergency room or call 911.  A surgeon from [facility] is always on call at the hospital.     For further information, please visit [email or web address]      

## 2018-10-04 NOTE — Op Note (Signed)
Patient Name:           Brittany Carr   Date of Surgery:        10/04/2018  Pre op Diagnosis:      Bilateral breast cancer  Post op Diagnosis:    Bilateral breast cancer  Procedure:                 Insertion PowerPort [place] 8 French venous vascular access device                                      Use of fluoroscopy for guidance and positioning  Surgeon:                     Edsel Petrin. Dalbert Batman, M.D., FACS  Assistant:                      OR staff  Operative Indications:   . This is a pleasant 72 year old female who returns for another postop visit and is also getting ready to have a Port-A-Cath inserted this coming Monday. She lives in [place]. Her PCP is Dr. Lorenda Ishihara. Her oncologist is Dr. Leretha Dykes. . She has also seen radiation oncology in [place] at [facility].      On August 16, 2018 she underwent bilateral breast surgery. Left breast lumpectomy with radioactive seed localization was performed and then right total mastectomy and targeted deep right axillary lymph node biopsy and sentinel biopsy.     pathology of the left lumpectomy showed benign radial scar.   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 One out of 6 lymph nodes positive. ER 100%. PR 60%. HER-2 negative. Ki-67 30%. Drains were removed on October 21 and she has had no problems with the wound.     We discussed management of her axilla in our breast conference and this was controversial as to whether to complete axillary  dissection or not. No consensus was reached. The cancer doctors in [place] performed Oncotype 25 WITH A RECURRENT SCORE OF 20%. Recommendation from [facility] cancer center was chemotherapy and Port-A-Cath insertion. She is scheduled for Port-A-Cath insertion on Monday, November 25. She lives alone and does not have anyone that can drive her home or stay with her. Therefore [facility] will [facility] and [facility] herself home the next  dayd. I will see her back in 5 months Left breast mammogram in 1 year  Operative Findings:       We inserted the Port-A-Cath through the right right subclavian vein.  We had excellent blood return and it flushed easily.  Fluoroscopy confirmed the catheter tip in the superior vena cava just above the right atrium and no deformity of the catheter anywhere.  Her right mastectomy wound and left lumpectomy wound have healed very nicely  Procedure in Detail:          Following the induction of general LMA anesthesia the patient was positioned with a small roll behind her shoulders and her arms tucked at her sides.  The neck and chest were prepped and draped in a sterile fashion.  Intravenous antibiotics were given.  Surgical timeout was performed.  0.25% Marcaine with epinephrine was used as a local infiltration anesthetic.      A right subclavian venipuncture was performed.  Excellent blood return and the wire threaded easily.  Fluoroscopy confirmed the wire in the superior vena cava.  A small incision was made at the wire insertion site.  A transverse incision was made about 4 cm below the midpoint of the right clavicle.  A subcutaneous pocket was created.  Using the tunneling device I drew the catheter from the wire insertion site to the port pocket site.  Using a previously drawn template on the chest wall which was done with the aid of fluoroscopy, I measured the catheter and cut it 22 cm in length.  The intent was to have the catheter tip in the superior vena cava just above the right atrium.  The catheter was secured to the port with the locking device and the port and catheter flushed with heparinized saline.  The port was sutured to the pectoralis fascia with 3 interrupted sutures of 2-0 Prolene.  The dilator and peel-away sheath assembly were inserted over the guidewire into the central venous circulation without resistance.  The wire and dilator were removed.  The catheter was threaded and the peel-away  sheath removed.  The catheter flushed easily had had excellent blood return.  Fluoroscopy confirmed that the catheter looked good with the tip in the superior vena cava just above the right atrium and no deformity of the catheter anywhere.  The port and catheter were flushed with concentrated heparin.  Hemostasis was excellent.  The subcutaneous tissue was closed with 3-0 Vicryl sutures and the skin closed with subcuticular 4-0 Monocryl and Dermabond.  The patient tolerated the procedure well and was taken to PACU in stable condition.  EBL 10 cc.  Counts correct.  Complications none.  A chest x-ray is planned in PACU.    Addendum: I logged onto the [employer] and reviewed her prescription medication history     Shaylinn Hladik M. Dalbert Batman, M.D., FACS General and Minimally Invasive Surgery Breast and Colorectal Surgery  10/04/2018 1:55 PM

## 2018-10-04 NOTE — Anesthesia Postprocedure Evaluation (Signed)
Anesthesia Post Note  Patient: Brittany Carr  Procedure(s) Performed: INSERTION PORT-A-CATH (Right Chest)     Patient location during evaluation: PACU Anesthesia Type: General Level of consciousness: awake and alert Pain management: pain level controlled Vital Signs Assessment: post-procedure vital signs reviewed and stable Respiratory status: spontaneous breathing, nonlabored ventilation, respiratory function stable and patient connected to nasal cannula oxygen Cardiovascular status: blood pressure returned to baseline and stable Postop Assessment: no apparent nausea or vomiting Anesthetic complications: no    Last Vitals:  Vitals:   10/04/18 1402 10/04/18 1403  BP: (!) 144/58   Pulse: 81 80  Resp: 13 13  Temp: 36.6 C   SpO2: 100% 99%    Last Pain:  Vitals:   10/04/18 1402  TempSrc:   PainSc: 0-No pain                 [name] DAVID

## 2018-10-05 ENCOUNTER — Encounter (HOSPITAL_BASED_OUTPATIENT_CLINIC_OR_DEPARTMENT_OTHER): Payer: Self-pay | Admitting: General Surgery

## 2018-10-05 NOTE — Discharge Summary (Signed)
Patient ID: Brittany Carr [id number] 72 y.o. [birth year]  Admit date: 10/04/2018  Discharge date and time: 10/05/2018  5:52 AM  Admitting Physician: Adin Hector  Discharge Physician: Adin Hector  Admission Diagnoses: Right breast cancer  Discharge Diagnoses: right breast cancer                                         Chronic GERD  Operations: Procedure(s): INSERTION PORT-A-CATH  Admission Condition: good  Discharged Condition: good  Indication for Admission: this is a pleasant 72 year old femalewho is brought to the hospital for Port-A-Cath insertion. She lives in [place]. Her PCP is Dr. Lorenda Ishihara. Her oncologist is Dr. Leretha Dykes. . She has also seen radiation oncology in [place] at [facility].      On August 16, 2018 she underwent bilateral breast surgery. Left breast lumpectomy with radioactive seed localization was performed and then right total mastectomy and targeted deep right axillary lymph node biopsy and sentinel biopsy.     pathology of the left lumpectomy showed benign radial scar.   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 One out of 6 lymph nodes positive. ER 100%. PR 60%. HER-2 negative. Ki-67 30%. Drains were removed on October 21 and she has had no problems with the wound.     We discussed management of her axilla in our breast conference and this was controversial as to whether to complete axillary  dissection or not. No consensus was reached. The cancer doctors in [place] performed Oncotype 25 WITH A RECURRENT SCORE OF 20%. Recommendation from [facility] cancer center was chemotherapy and Port-A-Cath insertion. She is scheduled for Port-A-Cath insertion on Monday, November 25. She lives alone and does not have anyone that can drive her home or stay with her. Therefore [facility] and [facility] herself home the next dayd. I will see her back in 5 months Left breast  mammogram in 1 year   Hospital Course: on the day of admission she was taken to the operating room and underwent Port-A-Cath insertion.  The procedure was uneventful.  Postoperative chest x-ray looked good.  She was observed overnight and did well and was discharged home the following morning.  No wound problems.  She was seen back in 5 months.  Left breast mammogram in 1 year  Consults: None  Significant Diagnostic Studies: lab and chest x-ray  Treatments: surgery: insertion Port-A-Cath  Disposition: Home  Patient Instructions:  Allergies as of 10/05/2018      Reactions   Sulfa Antibiotics Other (See Comments)   "make me feel weird" , did not work      Medication List    TAKE these medications   acetaminophen 500 MG tablet Commonly known as:  TYLENOL Take 1,000 mg by mouth every 8 (eight) hours as needed (for pain.).   FIBER-CAPS PO Take 1 capsule by mouth daily.   HYDROcodone-acetaminophen 5-325 MG tablet Commonly known as:  NORCO/VICODIN Take 1-2 tablets by mouth every 6 (six) hours as needed for moderate pain or severe pain.   ibuprofen 200 MG tablet Commonly known as:  ADVIL,MOTRIN Take 400 mg by mouth every 8 (eight) hours as needed (for pain.).   lisinopril 20 MG tablet Commonly known as:  PRINIVIL,ZESTRIL Take 20 mg by mouth daily.   multivitamin with minerals Tabs tablet Take 2 tablets by mouth  daily. One-A-Day Proactive 65+   OSTEO BI-FLEX TRIPLE STRENGTH PO Take 1 tablet by mouth daily.   pantoprazole 20 MG tablet Commonly known as:  PROTONIX Take 20 mg by mouth daily.   SYSTANE ULTRA 0.4-0.3 % Soln Generic drug:  Polyethyl Glycol-Propyl Glycol Place 1 drop into both eyes daily.       Activity: activity as tolerated Diet: low fat, low cholesterol diet Wound Care: as directed  Follow-up:  With Dr. Dalbert Batman in 6 months.  Signed: Edsel Petrin. Dalbert Batman, M.D., FACS General and minimally invasive surgery Breast and Colorectal Surgery  10/05/2018,  4:14 PM

## 2019-03-23 ENCOUNTER — Other Ambulatory Visit: Payer: Self-pay | Admitting: General Surgery

## 2019-05-06 ENCOUNTER — Encounter (HOSPITAL_BASED_OUTPATIENT_CLINIC_OR_DEPARTMENT_OTHER): Payer: Self-pay | Admitting: *Deleted

## 2019-05-06 ENCOUNTER — Other Ambulatory Visit: Payer: Self-pay

## 2019-05-12 ENCOUNTER — Other Ambulatory Visit (HOSPITAL_COMMUNITY)
Admission: RE | Admit: 2019-05-12 | Discharge: 2019-05-12 | Disposition: A | Discharge status: Home / Self Care | Payer: Medicare Other | Source: Ambulatory Visit | Attending: General Surgery | Admitting: General Surgery

## 2019-05-12 ENCOUNTER — Encounter (HOSPITAL_BASED_OUTPATIENT_CLINIC_OR_DEPARTMENT_OTHER)
Admission: RE | Admit: 2019-05-12 | Discharge: 2019-05-12 | Disposition: A | Discharge status: Home / Self Care | Payer: Medicare Other | Source: Ambulatory Visit | Attending: General Surgery | Admitting: General Surgery

## 2019-05-12 ENCOUNTER — Other Ambulatory Visit: Payer: Self-pay

## 2019-05-12 DIAGNOSIS — I491 Atrial premature depolarization: Secondary | ICD-10-CM | POA: Insufficient documentation

## 2019-05-12 DIAGNOSIS — Z01818 Encounter for other preprocedural examination: Secondary | ICD-10-CM | POA: Insufficient documentation

## 2019-05-12 DIAGNOSIS — Z1159 Encounter for screening for other viral diseases: Secondary | ICD-10-CM | POA: Insufficient documentation

## 2019-05-12 LAB — SARS CORONAVIRUS 2 (TAT 6-24 HRS): SARS Coronavirus 2: NEGATIVE

## 2019-05-12 NOTE — Progress Notes (Signed)
Ensure pre surgery drink given with instructions to complete by [facility], pt verbalized understanding.  Pt states does not have anyone to pick her up and stay with her after surgery, instructed pt to bring overnight bag and meds, notified Abigail Butts at Dr. Darrel Hoover office.

## 2019-05-15 NOTE — H&P (Signed)
Brittany Carr Location: [facility] Surgery Patient #: [id number] DOB: 1945/12/21 Single / Language: [name] / Race: White Female       History of Present Illness       This is a very pleasant 73 year old female from [place] who returns for long-term follow-up regarding her right breast cancer. Her PCP is Dr. Lorra Hals. Her oncologist is Dr. Katheran Awe.      August 16, 2018 she underwent left breast lumpectomy and a right total mastectomy and targeted right axillary lymph node biopsy. Pathology on the left showed benign radial scar. 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 One out of 6 lymph nodes positive. ER 100%. PR 60%. HER-2 negative. Ki-67 30%. I last saw her on September 29, 2018 she was doing well. She has completed chemotherapy in February of this year and completed radiation therapy in April of this year and is now taking Femara. She has no complaints about her wounds. No complaints about her shoulder or her arm. She requested we remove the Port-A-Cath. I told her we will schedule that as soon as we got clearance from her oncologist. Almost certainly he is through with using the Port-A-Cath. We will call his office today.       Exam today was excellent. Radiation changes are settling down. Minimal tenderness.      Scheduled for Port-A-Cath removal under monitored sedation if okay with oncology. Repeat left breast mammogram in September 2020 See me in October 2020   Allergies  Sulfa Antibiotics  Allergies Reconciled   Medication History  Lisinopril ('20MG'$  Tablet, Oral) Active. Pantoprazole Sodium ('20MG'$  Tablet DR, Oral) Active. Vitamin D (Cholecalciferol) (1000UNIT Capsule, Oral) Active. One-A-Day Proactive 65+ (Oral) Active. Osteo Complex (Oral) Active. Letrozole (2.'5MG'$  Tablet, Oral) Active. Mometasone Furoate (0.1% Cream, External) Active. Medications Reconciled  Vitals  Weight: 173  lb Height: 68in Body Surface Area: 1.92 m Body Mass Index: 26.3 kg/m  Temp.: 65F (Oral)  Pulse: 94 (Regular)  P.OX: 97% (Room air) BP: 160/70(Sitting, Left Arm, Standard)    Physical Exam  General Mental Status-Alert. General Appearance-Not in acute distress. Build & Nutrition-Well nourished. Posture-Normal posture. Gait-Normal.  Head and Neck Head-normocephalic, atraumatic with no lesions or palpable masses. Trachea-midline. Thyroid Gland Characteristics - normal size and consistency and no palpable nodules. Note: No cervical or supraclavicular adenopathy.   Chest and Lung Exam Chest and lung exam reveals -on auscultation, normal breath sounds, no adventitious sounds and normal vocal resonance.  Breast Note: Right mastectomy incision looks good. Still some erythema from radiation therapy. No fluid. No nodules or ulceration. No axillary mass. Full range of motion right shoulder. No arm swelling. No sensory deficit. Left breast lumpectomy incision laterally is well-healed. The left breast is soft. No other skin changes or mass. No left axillary adenopathy Port-A-Cath palpable right infraclavicular area. Goes into the subclavian vein. No sign of any wound problems.   Cardiovascular Cardiovascular examination reveals -normal heart sounds, regular rate and rhythm with no murmurs and femoral artery auscultation bilaterally reveals normal pulses, no bruits, no thrills.  Abdomen Inspection Inspection of the abdomen reveals - No Hernias. Palpation/Percussion Palpation and Percussion of the abdomen reveal - Soft, Non Tender, No Rigidity (guarding), No hepatosplenomegaly and No Palpable abdominal masses.  Neurologic Neurologic evaluation reveals -alert and oriented x 3 with no impairment of recent or remote memory, normal attention span and ability to concentrate, normal sensation and normal coordination.  Musculoskeletal Normal Exam -  Bilateral-Upper Extremity Strength Normal  and Lower Extremity Strength Normal.    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 Right shoulder range of motion is excellent  you have completed chemotherapy and radiation therapy You have requested that we removed your Port-A-Cath, and that is appropriate We will check with your oncologist, and then we will go ahead and schedule removal of your Port-A-Cath  as an outpatient in the near future  I discussed the indications, techniques, and risk of Port-A-Cath removal with you in detail.  Long-term plan is for you to continue the Femara, repeat left breast mammogram in September 2020, and see Dr. Dalbert Batman in October 2020   ABNORMAL MAMMOGRAM OF LEFT BREAST (R92.8)  HYPERTENSION, ESSENTIAL, BENIGN (I10)  CHRONIC GERD (K21.9)    Brittany Carr M. Dalbert Batman, M.D., [facility] General and Minimally invasive Surgery Breast and Colorectal Surgery Office:   [phone] Pager:   [phone]

## 2019-05-16 ENCOUNTER — Ambulatory Visit (HOSPITAL_BASED_OUTPATIENT_CLINIC_OR_DEPARTMENT_OTHER): Payer: Medicare Other | Admitting: Anesthesiology

## 2019-05-16 ENCOUNTER — Other Ambulatory Visit: Payer: Self-pay

## 2019-05-16 ENCOUNTER — Ambulatory Visit (HOSPITAL_BASED_OUTPATIENT_CLINIC_OR_DEPARTMENT_OTHER)
Admission: RE | Admit: 2019-05-16 | Discharge: 2019-05-16 | Disposition: A | Discharge status: Home / Self Care | Payer: Medicare Other | Attending: General Surgery | Admitting: General Surgery

## 2019-05-16 ENCOUNTER — Encounter (HOSPITAL_BASED_OUTPATIENT_CLINIC_OR_DEPARTMENT_OTHER): Payer: Self-pay | Admitting: Anesthesiology

## 2019-05-16 ENCOUNTER — Encounter (HOSPITAL_BASED_OUTPATIENT_CLINIC_OR_DEPARTMENT_OTHER)
Admission: RE | Disposition: A | Discharge status: Home / Self Care | Payer: Self-pay | Source: Home / Self Care | Attending: General Surgery

## 2019-05-16 DIAGNOSIS — I10 Essential (primary) hypertension: Secondary | ICD-10-CM | POA: Insufficient documentation

## 2019-05-16 DIAGNOSIS — Z79811 Long term (current) use of aromatase inhibitors: Secondary | ICD-10-CM | POA: Diagnosis not present

## 2019-05-16 DIAGNOSIS — C50111 Malignant neoplasm of central portion of right female breast: Secondary | ICD-10-CM | POA: Diagnosis not present

## 2019-05-16 DIAGNOSIS — Z79899 Other long term (current) drug therapy: Secondary | ICD-10-CM | POA: Insufficient documentation

## 2019-05-16 DIAGNOSIS — Z9221 Personal history of antineoplastic chemotherapy: Secondary | ICD-10-CM | POA: Diagnosis not present

## 2019-05-16 DIAGNOSIS — K219 Gastro-esophageal reflux disease without esophagitis: Secondary | ICD-10-CM | POA: Insufficient documentation

## 2019-05-16 DIAGNOSIS — C50112 Malignant neoplasm of central portion of left female breast: Secondary | ICD-10-CM | POA: Diagnosis present

## 2019-05-16 DIAGNOSIS — Z9011 Acquired absence of right breast and nipple: Secondary | ICD-10-CM | POA: Insufficient documentation

## 2019-05-16 DIAGNOSIS — C773 Secondary and unspecified malignant neoplasm of axilla and upper limb lymph nodes: Secondary | ICD-10-CM | POA: Insufficient documentation

## 2019-05-16 DIAGNOSIS — Z170 Estrogen receptor positive status [ER+]: Secondary | ICD-10-CM | POA: Insufficient documentation

## 2019-05-16 DIAGNOSIS — Z452 Encounter for adjustment and management of vascular access device: Secondary | ICD-10-CM | POA: Insufficient documentation

## 2019-05-16 DIAGNOSIS — Z923 Personal history of irradiation: Secondary | ICD-10-CM | POA: Insufficient documentation

## 2019-05-16 HISTORY — PX: PORT-A-CATH REMOVAL: SHX5289

## 2019-05-16 SURGERY — REMOVAL PORT-A-CATH
Anesthesia: Monitor Anesthesia Care | Site: Chest

## 2019-05-16 MED ORDER — DEXAMETHASONE SODIUM PHOSPHATE 10 MG/ML IJ SOLN
INTRAMUSCULAR | Status: AC
  Filled 2019-05-16: qty 1

## 2019-05-16 MED ORDER — PROPOFOL 10 MG/ML IV BOLUS
INTRAVENOUS | Status: AC
  Filled 2019-05-16: qty 20

## 2019-05-16 MED ORDER — FENTANYL CITRATE (PF) 100 MCG/2ML IJ SOLN: 25.0000 ug | INTRAMUSCULAR | Status: DC | PRN

## 2019-05-16 MED ORDER — ACETAMINOPHEN 500 MG PO TABS
1000.0000 mg | ORAL_TABLET | ORAL | Status: AC
  Administered 2019-05-16: 1000 mg via ORAL

## 2019-05-16 MED ORDER — CEFAZOLIN SODIUM-DEXTROSE 2-4 GM/100ML-% IV SOLN
INTRAVENOUS | Status: AC
  Filled 2019-05-16: qty 100

## 2019-05-16 MED ORDER — LIDOCAINE 1 % OPTIME INJ - NO CHARGE
INTRAMUSCULAR | Status: DC | PRN
  Administered 2019-05-16: 15 mL

## 2019-05-16 MED ORDER — GABAPENTIN 300 MG PO CAPS: 300.0000 mg | ORAL_CAPSULE | ORAL | Status: DC

## 2019-05-16 MED ORDER — ONDANSETRON HCL 4 MG/2ML IJ SOLN: 4.0000 mg | Freq: Once | INTRAMUSCULAR | Status: DC | PRN

## 2019-05-16 MED ORDER — LACTATED RINGERS IV SOLN
INTRAVENOUS | Status: DC
  Administered 2019-05-16: 08:00:00 via INTRAVENOUS

## 2019-05-16 MED ORDER — CHLORHEXIDINE GLUCONATE CLOTH 2 % EX PADS: 6.0000 | MEDICATED_PAD | Freq: Once | CUTANEOUS | Status: DC

## 2019-05-16 MED ORDER — MIDAZOLAM HCL 2 MG/2ML IJ SOLN: 1.0000 mg | INTRAMUSCULAR | Status: DC | PRN

## 2019-05-16 MED ORDER — LIDOCAINE 2% (20 MG/ML) 5 ML SYRINGE
INTRAMUSCULAR | Status: AC
  Filled 2019-05-16: qty 5

## 2019-05-16 MED ORDER — FENTANYL CITRATE (PF) 100 MCG/2ML IJ SOLN: 50.0000 ug | INTRAMUSCULAR | Status: DC | PRN

## 2019-05-16 MED ORDER — SODIUM BICARBONATE 4 % IV SOLN
INTRAVENOUS | Status: DC | PRN
  Administered 2019-05-16: 2 mL via INTRAVENOUS

## 2019-05-16 MED ORDER — ACETAMINOPHEN 500 MG PO TABS
ORAL_TABLET | ORAL | Status: AC
  Filled 2019-05-16: qty 2

## 2019-05-16 MED ORDER — ONDANSETRON HCL 4 MG/2ML IJ SOLN
INTRAMUSCULAR | Status: AC
  Filled 2019-05-16: qty 2

## 2019-05-16 MED ORDER — SODIUM CHLORIDE 0.9% FLUSH: 3.0000 mL | Freq: Two times a day (BID) | INTRAVENOUS | Status: DC

## 2019-05-16 MED ORDER — CEFAZOLIN SODIUM-DEXTROSE 2-4 GM/100ML-% IV SOLN
2.0000 g | INTRAVENOUS | Status: AC
  Administered 2019-05-16: 2 g via INTRAVENOUS

## 2019-05-16 SURGICAL SUPPLY — 38 items
BENZOIN TINCTURE PRP APPL 2/3 (GAUZE/BANDAGES/DRESSINGS) IMPLANT
BLADE HEX COATED 2.75 (ELECTRODE) ×2 IMPLANT
BLADE SURG 15 STRL LF DISP TIS (BLADE) ×1 IMPLANT
BLADE SURG 15 STRL SS (BLADE) ×1
CHLORAPREP W/TINT 26 (MISCELLANEOUS) ×2 IMPLANT
COVER BACK TABLE REUSABLE LG (DRAPES) ×2 IMPLANT
COVER MAYO STAND REUSABLE (DRAPES) ×2 IMPLANT
COVER WAND RF STERILE (DRAPES) IMPLANT
DECANTER SPIKE VIAL GLASS SM (MISCELLANEOUS) IMPLANT
DERMABOND ADVANCED (GAUZE/BANDAGES/DRESSINGS) ×1
DERMABOND ADVANCED .7 DNX12 (GAUZE/BANDAGES/DRESSINGS) ×1 IMPLANT
DRAPE LAPAROTOMY 100X72 PEDS (DRAPES) ×2 IMPLANT
DRAPE UTILITY XL STRL (DRAPES) ×2 IMPLANT
DRSG TEGADERM 4X4.75 (GAUZE/BANDAGES/DRESSINGS) IMPLANT
ELECT REM PT RETURN 9FT ADLT (ELECTROSURGICAL) ×2
ELECTRODE REM PT RTRN 9FT ADLT (ELECTROSURGICAL) ×1 IMPLANT
GAUZE SPONGE 4X4 12PLY STRL LF (GAUZE/BANDAGES/DRESSINGS) IMPLANT
GLOVE BIO SURGEON STRL SZ 6.5 (GLOVE) ×2 IMPLANT
GLOVE BIOGEL PI IND STRL 6.5 (GLOVE) ×1 IMPLANT
GLOVE BIOGEL PI IND STRL 8 (GLOVE) ×1 IMPLANT
GLOVE BIOGEL PI INDICATOR 6.5 (GLOVE) ×1
GLOVE BIOGEL PI INDICATOR 8 (GLOVE) ×1
GLOVE EUDERMIC 7 POWDERFREE (GLOVE) ×2 IMPLANT
GOWN STRL REUS W/ TWL LRG LVL3 (GOWN DISPOSABLE) ×1 IMPLANT
GOWN STRL REUS W/ TWL XL LVL3 (GOWN DISPOSABLE) ×1 IMPLANT
GOWN STRL REUS W/TWL LRG LVL3 (GOWN DISPOSABLE) ×1
GOWN STRL REUS W/TWL XL LVL3 (GOWN DISPOSABLE) ×1
NEEDLE HYPO 25X1 1.5 SAFETY (NEEDLE) ×2 IMPLANT
PACK BASIN DAY SURGERY FS (CUSTOM PROCEDURE TRAY) ×2 IMPLANT
PENCIL BUTTON HOLSTER BLD 10FT (ELECTRODE) ×2 IMPLANT
SLEEVE SCD COMPRESS KNEE MED (MISCELLANEOUS) ×2 IMPLANT
STRIP CLOSURE SKIN 1/2X4 (GAUZE/BANDAGES/DRESSINGS) IMPLANT
SUT MNCRL AB 4-0 PS2 18 (SUTURE) ×2 IMPLANT
SUT VICRYL 3-0 CR8 SH (SUTURE) ×2 IMPLANT
SYR 10ML LL (SYRINGE) ×2 IMPLANT
TOWEL GREEN STERILE FF (TOWEL DISPOSABLE) ×2 IMPLANT
TUBE CONNECTING 20X1/4 (TUBING) IMPLANT
YANKAUER SUCT BULB TIP NO VENT (SUCTIONS) IMPLANT

## 2019-05-16 NOTE — Anesthesia Preprocedure Evaluation (Signed)
Anesthesia Evaluation  Patient identified by MRN, date of birth, ID band Patient awake    Reviewed: Allergy & Precautions, NPO status , Patient's Chart, lab work & pertinent test results  Airway Mallampati: III  TM Distance: >3 FB Neck ROM: Full    Dental no notable dental hx.    Pulmonary neg pulmonary ROS,    Pulmonary exam normal breath sounds clear to auscultation       Cardiovascular hypertension, Pt. on medications Normal cardiovascular exam Rhythm:Regular Rate:Normal     Neuro/Psych  Headaches, negative psych ROS   GI/Hepatic Neg liver ROS, GERD  Medicated and Controlled,  Endo/Other  negative endocrine ROS  Renal/GU negative Renal ROS     Musculoskeletal negative musculoskeletal ROS (+)   Abdominal   Peds  Hematology negative hematology ROS (+)   Anesthesia Other Findings RIGHT BREAST CANCER  Reproductive/Obstetrics                             Anesthesia Physical Anesthesia Plan  ASA: III  Anesthesia Plan: MAC   Post-op Pain Management:    Induction: Intravenous  PONV Risk Score and Plan: 2 and Treatment may vary due to age or medical condition  Airway Management Planned: Natural Airway  Additional Equipment:   Intra-op Plan:   Post-operative Plan:   Informed Consent: I have reviewed the patients History and Physical, chart, labs and discussed the procedure including the risks, benefits and alternatives for the proposed anesthesia with the patient or authorized representative who has indicated his/her understanding and acceptance.     Dental advisory given  Plan Discussed with: CRNA and Surgeon  Anesthesia Plan Comments: (Local only)        Anesthesia Quick Evaluation

## 2019-05-16 NOTE — Op Note (Signed)
Patient Name:           Brittany Carr   Date of Surgery:        05/16/2019  Pre op Diagnosis:      Cancer right breast                                       Status post adjuvant chemotherapy                                       Right chest wall radiation therapy  Post op Diagnosis:    Same  Procedure:                 Removal of Port-A-Cath  Surgeon:                     Edsel Petrin. Dalbert Batman, M.D., FACS  Assistant:                      Or staff  Operative Indications:   This is a very pleasant 73 year old female from [place] who returns for long-term follow-up regarding her right breast cancer and removal of her Port-A-Cath. Her PCP is Dr. Lorra Hals. Her oncologist is Dr. Katheran Awe.      August 16, 2018 she underwent left breast lumpectomy and a right total mastectomy and targeted right axillary lymph node biopsy. Pathology on the left showed benign radial scar. 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 One out of 6 lymph nodes positive. ER 100%. PR 60%. HER-2 negative. Ki-67 30%. She has completed chemotherapy in February of this year and completed radiation therapy in April of this year and is now taking Femara.. She requested we remove the Port-A-Cath. I told her we will schedule that in the near future.  I discussed the procedure, techniques, and risks in detail. Plan mammograms in September, 2020 See me in October 2020  Operative Findings:       The port was in the right infraclavicular area through the right subclavian vein.  There was no bleeding or infection.  Removal was uneventful.  Procedure in Detail:          The patient was brought to the operating room at [facility] day surgery center.  This was done under straight local anesthesia.  Surgical timeout was performed.  The right upper chest was prepped and draped in a sterile fashion.  1% Xylocaine with epinephrine mixed with a small amount of sodium bicarbonate was used as a  local infiltration anesthetic.  This was very effective.  Transverse incision was made overlying the port, through the old scar.  The port was dissected away from its capsule.  It was elevated and all 3 Prolene sutures cut and removed.  The port and catheter then were removed without difficulty.  The subcutaneous tissues were closed with interrupted 3-0 Vicryl and the skin closed with subcuticular 4-0 Monocryl and Dermabond.  Patient tolerated the procedure well and was taken to PACU in stable condition.  EBL 5 cc.  Counts correct.  Complications none.    Addendum: I logged onto the PMP aware website and reviewed her prescription medication history.     Edsel Petrin. Dalbert Batman, M.D., FACS General and Minimally Invasive Surgery Breast and Colorectal Surgery  05/16/2019 9:09 AM

## 2019-05-16 NOTE — Discharge Instructions (Signed)
The incision is closed with dissolving sutures. The skin is covered with a clear plastic superglue, which will wear off in about 3 weeks  There will be minor discomfort. The pain will be well controlled by high-dose Tylenol or high-dose ibuprofen Instructions for those medications are detailed below.  You should stay in the house for 24 hours Put an ice pack on this for 10 minutes at a time, twice an hour  You may shower starting tomorrow No tub baths for 2 weeks  You may drive your car but  avoid long drives or high traffic areas for a few days   You are due for diagnostic mammogram in September See Dr. Dalbert Batman in October  Keep your appointments with all the other doctors as scheduled            Managing Your Pain After Surgery Without Opioids    Thank you for participating in our program to help patients manage their pain after surgery without opioids. This is part of our effort to provide you with the best care possible, without exposing you or your family to the risk that opioids pose.  What pain can I expect after surgery? You can expect to have some pain after surgery. This is normal. The pain is typically worse the day after surgery, and quickly begins to get better. Many studies have found that many patients are able to manage their pain after surgery with Over-the-Counter (OTC) medications such as Tylenol and Motrin. If you have a condition that does not allow you to take Tylenol or Motrin, notify your surgical team.  How will I manage my pain? The best strategy for controlling your pain after surgery is around the clock pain control with Tylenol (acetaminophen) and Motrin (ibuprofen or Advil). Alternating these medications with each other allows you to maximize your pain control. In addition to Tylenol and Motrin, you can use heating pads or ice packs on your incisions to help reduce your pain.  How will I alternate your regular strength over-the-counter  pain medication? You will take a dose of pain medication every three hours. ; Start by taking 650 mg of Tylenol (2 pills of 325 mg) ; 3 hours later take 600 mg of Motrin (3 pills of 200 mg) ; 3 hours after taking the Motrin take 650 mg of Tylenol ; 3 hours after that take 600 mg of Motrin.   - 1 -  See example - if your first dose of Tylenol is at 12:00 PM   12:00 PM Tylenol 650 mg (2 pills of 325 mg)  3:00 PM Motrin 600 mg (3 pills of 200 mg)  6:00 PM Tylenol 650 mg (2 pills of 325 mg)  9:00 PM Motrin 600 mg (3 pills of 200 mg)  Continue alternating every 3 hours   We recommend that you follow this schedule around-the-clock for at least 3 days after surgery, or until you feel that it is no longer needed. Use the table on the last page of this handout to keep track of the medications you are taking. Important: Do not take more than 3000mg  of Tylenol or 3200mg  of Motrin in a 24-hour period. Do not take ibuprofen/Motrin if you have a history of bleeding stomach ulcers, severe kidney disease, &/or actively taking a blood thinner  What if I still have pain? If you have pain that is not controlled with the over-the-counter pain medications (Tylenol and Motrin or Advil) you might have what we call breakthrough pain. You will  receive a prescription for a small amount of an opioid pain medication such as Oxycodone, Tramadol, or Tylenol with Codeine. Use these opioid pills in the first 24 hours after surgery if you have breakthrough pain. Do not take more than 1 pill every 4-6 hours.  If you still have uncontrolled pain after using all opioid pills, don't hesitate to call our staff using the number provided. We will help make sure you are managing your pain in the best way possible, and if necessary, we can provide a prescription for additional pain medication.   Day 1    Time  Name of Medication Number of pills taken  Amount of Acetaminophen  Pain Level   Comments  AM PM       AM PM        AM PM       AM PM       AM PM       AM PM       AM PM       AM PM       Total Daily amount of Acetaminophen Do not take more than  3,000 mg per day      Day 2    Time  Name of Medication Number of pills taken  Amount of Acetaminophen  Pain Level   Comments  AM PM       AM PM       AM PM       AM PM       AM PM       AM PM       AM PM       AM PM       Total Daily amount of Acetaminophen Do not take more than  3,000 mg per day      Day 3    Time  Name of Medication Number of pills taken  Amount of Acetaminophen  Pain Level   Comments  AM PM       AM PM       AM PM       AM PM          AM PM       AM PM       AM PM       AM PM       Total Daily amount of Acetaminophen Do not take more than  3,000 mg per day      Day 4    Time  Name of Medication Number of pills taken  Amount of Acetaminophen  Pain Level   Comments  AM PM       AM PM       AM PM       AM PM       AM PM       AM PM       AM PM       AM PM       Total Daily amount of Acetaminophen Do not take more than  3,000 mg per day      Day 5    Time  Name of Medication Number of pills taken  Amount of Acetaminophen  Pain Level   Comments  AM PM       AM PM       AM PM       AM PM       AM PM  AM PM       AM PM       AM PM       Total Daily amount of Acetaminophen Do not take more than  3,000 mg per day       Day 6    Time  Name of Medication Number of pills taken  Amount of Acetaminophen  Pain Level  Comments  AM PM       AM PM       AM PM       AM PM       AM PM       AM PM       AM PM       AM PM       Total Daily amount of Acetaminophen Do not take more than  3,000 mg per day      Day 7    Time  Name of Medication Number of pills taken  Amount of Acetaminophen  Pain Level   Comments  AM PM       AM PM       AM PM       AM PM       AM PM       AM PM       AM PM       AM PM       Total Daily amount of Acetaminophen Do not  take more than  3,000 mg per day        For additional information about how and where to safely dispose of unused opioid medications - [employer]  Disclaimer: This document contains information and/or instructional materials adapted from [place] for the typical patient with your condition. It does not replace medical advice from your health care provider because your experience may differ from that of the typical patient. Talk to your health care provider if you have any questions about this document, your condition or your treatment plan. Adapted from [place]

## 2019-05-16 NOTE — Anesthesia Procedure Notes (Signed)
Procedure Name: MAC Date/Time: 05/16/2019 8:45 AM Performed by: Marrianne Mood, CRNA Pre-anesthesia Checklist: Patient identified, Emergency Drugs available, Suction available, Patient being monitored and Timeout performed Patient Re-evaluated:Patient Re-evaluated prior to induction Oxygen Delivery Method: Simple face mask

## 2019-05-16 NOTE — Transfer of Care (Signed)
Immediate Anesthesia Transfer of Care Note  Patient: Brittany Carr  Procedure(s) Performed: REMOVAL PORT-A-CATH (N/A Chest)  Patient Location: PACU  Anesthesia Type:MAC  Level of Consciousness: awake and patient cooperative  Airway & Oxygen Therapy: Patient Spontanous Breathing and Patient connected to nasal cannula oxygen  Post-op Assessment: Report given to RN and Post -op Vital signs reviewed and stable  Post vital signs: Reviewed and stable  Last Vitals:  Vitals Value Taken Time  BP    Temp    Pulse 66 05/16/19 0913  Resp 19 05/16/19 0913  SpO2 100 % 05/16/19 0913  Vitals shown include unvalidated device data.  Last Pain:  Vitals:   05/16/19 0720  TempSrc: Oral  PainSc: 0-No pain         Complications: No apparent anesthesia complications

## 2019-05-16 NOTE — Anesthesia Postprocedure Evaluation (Signed)
Anesthesia Post Note  Patient: Brittany Carr  Procedure(s) Performed: REMOVAL PORT-A-CATH (N/A Chest)     Patient location during evaluation: PACU Anesthesia Type: MAC Level of consciousness: awake and alert Pain management: pain level controlled Vital Signs Assessment: post-procedure vital signs reviewed and stable Respiratory status: spontaneous breathing, nonlabored ventilation, respiratory function stable and patient connected to nasal cannula oxygen Cardiovascular status: stable and blood pressure returned to baseline Postop Assessment: no apparent nausea or vomiting Anesthetic complications: no Comments: Local only    Last Vitals:  Vitals:   05/16/19 0915 05/16/19 0945  BP: (!) 149/70 (!) 166/82  Pulse: 65 82  Resp: 19 16  Temp:  36.6 C  SpO2: 100% 99%    Last Pain:  Vitals:   05/16/19 0945  TempSrc:   PainSc: 0-No pain                 Ryan P Ellender

## 2019-05-16 NOTE — Interval H&P Note (Signed)
History and Physical Interval Note:  05/16/2019 7:03 AM  Brittany Carr  has presented today for surgery, with the diagnosis of RIGHT BREAST CANCER.  The various methods of treatment have been discussed with the patient and family. After consideration of risks, benefits and other options for treatment, the patient has consented to  Procedure(s): REMOVAL PORT-A-CATH (N/A) as a surgical intervention.  The patient's history has been reviewed, patient examined, no change in status, stable for surgery.  I have reviewed the patient's chart and labs.  Questions were answered to the patient's satisfaction.     Adin Hector

## 2019-05-17 ENCOUNTER — Encounter (HOSPITAL_BASED_OUTPATIENT_CLINIC_OR_DEPARTMENT_OTHER): Payer: Self-pay | Admitting: General Surgery

## 2019-05-20 ENCOUNTER — Other Ambulatory Visit: Payer: Self-pay | Admitting: General Surgery

## 2019-05-20 DIAGNOSIS — Z1231 Encounter for screening mammogram for malignant neoplasm of breast: Secondary | ICD-10-CM

## 2019-07-25 ENCOUNTER — Ambulatory Visit
Admission: RE | Admit: 2019-07-25 | Discharge: 2019-07-25 | Disposition: A | Discharge status: Home / Self Care | Payer: Medicare Other | Source: Ambulatory Visit | Attending: General Surgery | Admitting: General Surgery

## 2019-07-25 ENCOUNTER — Other Ambulatory Visit: Payer: Self-pay

## 2019-07-25 DIAGNOSIS — Z1231 Encounter for screening mammogram for malignant neoplasm of breast: Secondary | ICD-10-CM

## 2019-12-21 ENCOUNTER — Other Ambulatory Visit: Payer: Self-pay | Admitting: Oncology

## 2019-12-21 DIAGNOSIS — Z1231 Encounter for screening mammogram for malignant neoplasm of breast: Secondary | ICD-10-CM

## 2020-01-12 IMAGING — MG STEREOTACTIC CORE NEEDLE BIOPSY
8 of 9 series · 8 of 17 positions shown · non-contrast
Comparison: Previous exams.

ADDENDUM:
Pathology revealed COMPLEX SCLEROSING LESION, CALCIFICATIONS of LEFT
breast, outer at middle depth. This was found to be concordant by
Dr. Ferienhaus Erxleben with excision recommended.

Pathology results were discussed with the patient by telephone. The
patient reported doing well after the biopsy with tenderness at the
site. Post biopsy instructions and care were reviewed and questions
were answered. The patient was encouraged to call The [REDACTED]
Per the request of Solomon at [facility] [employer] [facility], of [HOSPITAL] [REDACTED] at [HOSPITAL], [HOSPITAL] [HOSPITAL], a surgical consultation has been arranged
with Dr. Neeka Braun at [REDACTED] on August 05, 2018.
The patient has a recent diagnosis of RIGHT breast cancer and should
discuss a treatment plan at the August 05, 2018 surgical
consultation.
Pathology results reported by Maripaz Rebelo, RN on 07/27/2018.
CLINICAL DATA: 71-year-old with biopsy-proven grade 3 invasive
ductal carcinoma involving the UPPER OUTER QUADRANT of the RIGHT
breast (ultrasound-guided core needle biopsy 07/21/2018 at [HOSPITAL]
[facility] [REDACTED[facility] in [HOSPITAL], [place] [HOSPITAL]). Recent
diagnostic workup demonstrated architectural distortion involving
the OUTER LEFT breast at MIDDLE depth which did not have a definite
sonographic correlate. Stereotactic tomosynthesis biopsy of this
architectural distortion is performed.
EXAM:
LEFT BREAST STEREOTACTIC CORE NEEDLE BIOPSY

[L (1 of 6)]
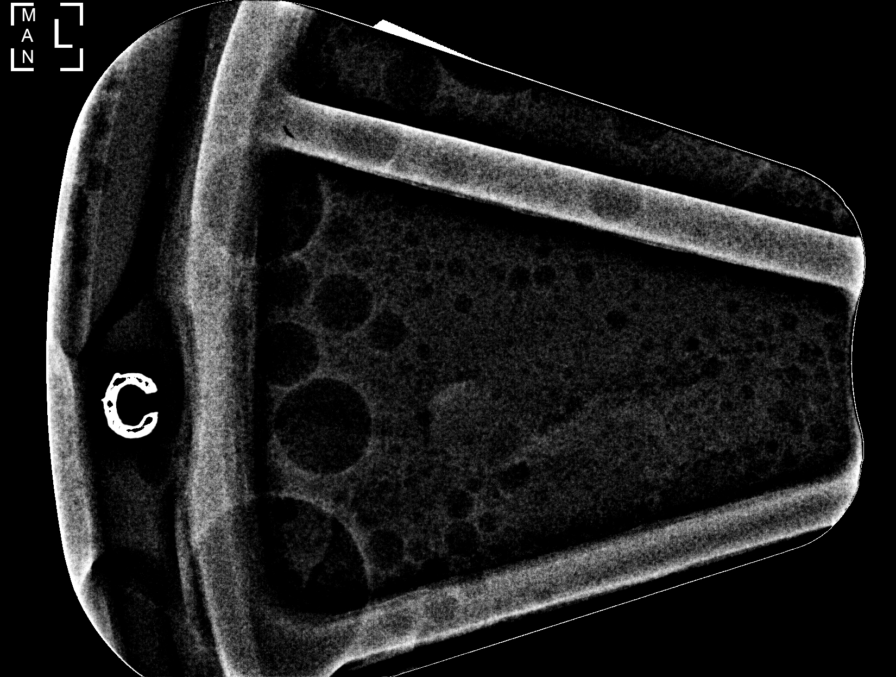

[L (2 of 6)]
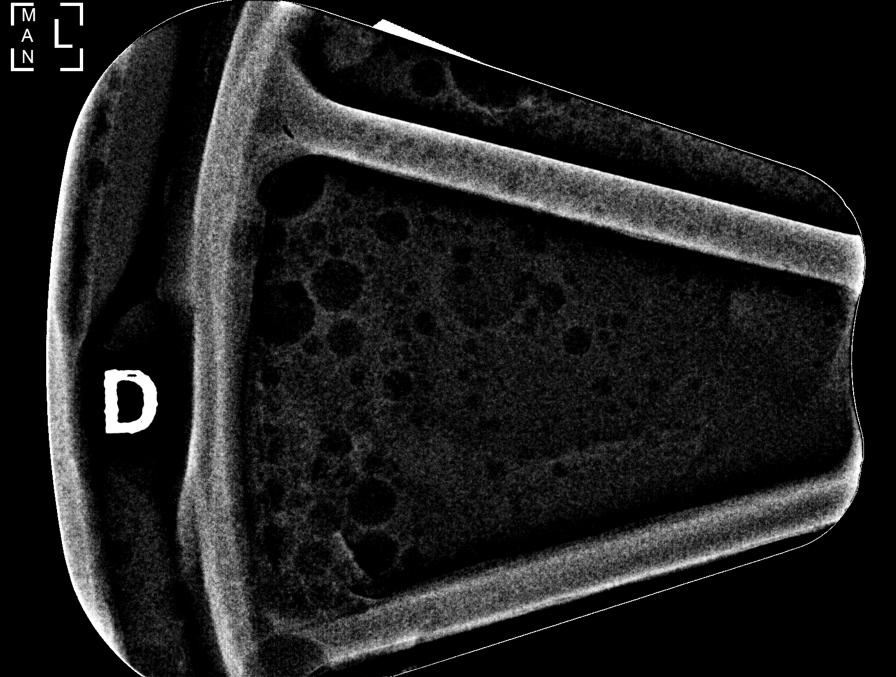

[L (3 of 6)]
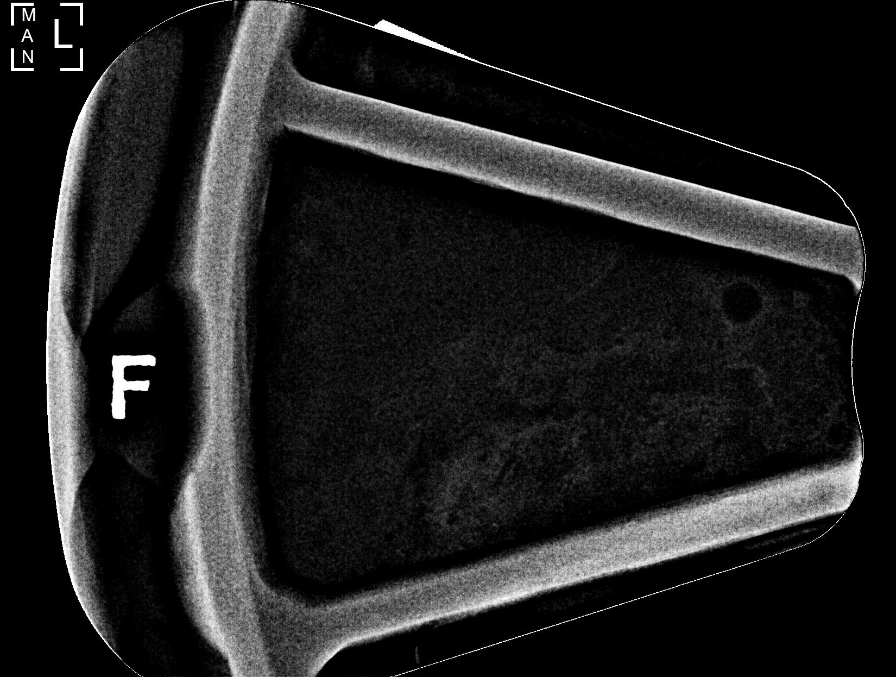

[L (4 of 6)]
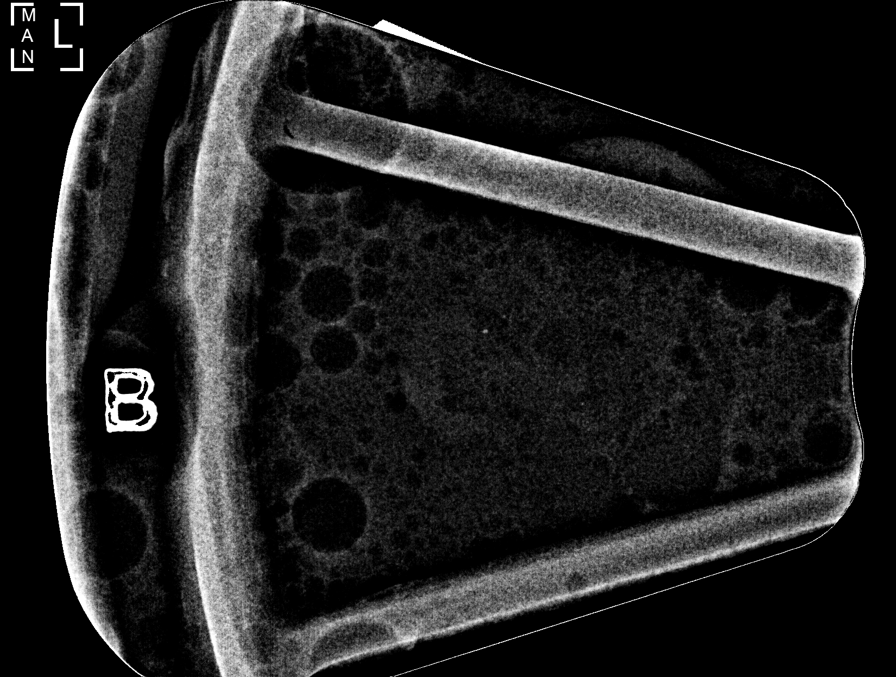

[L (5 of 6)]
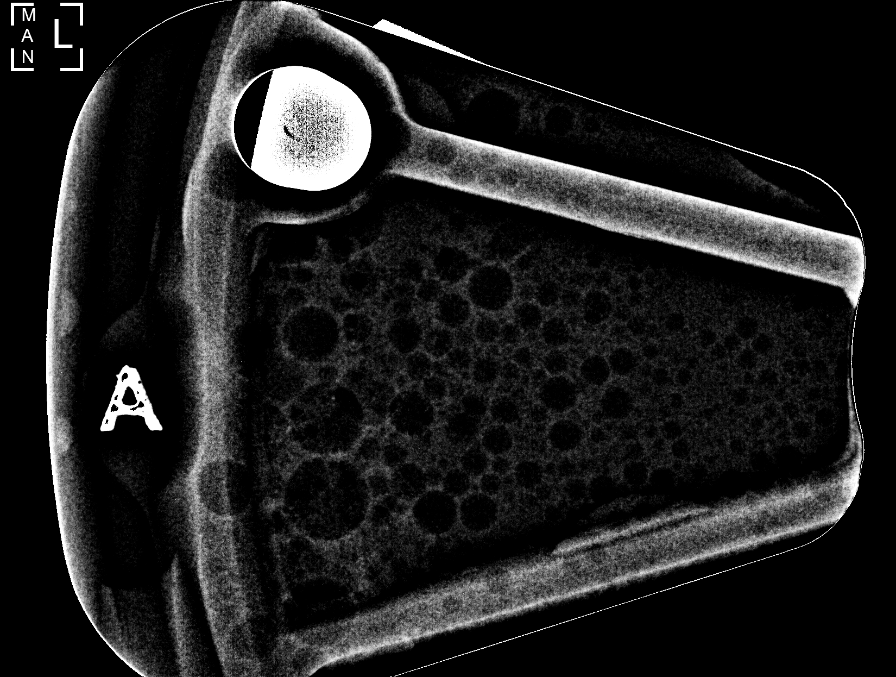

[L (6 of 6)]
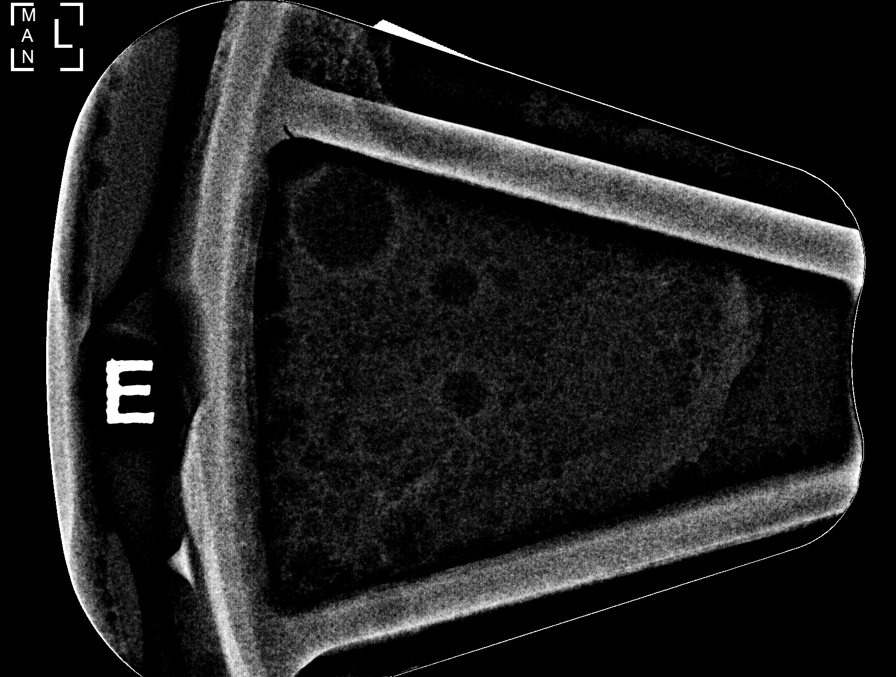

[L CC]
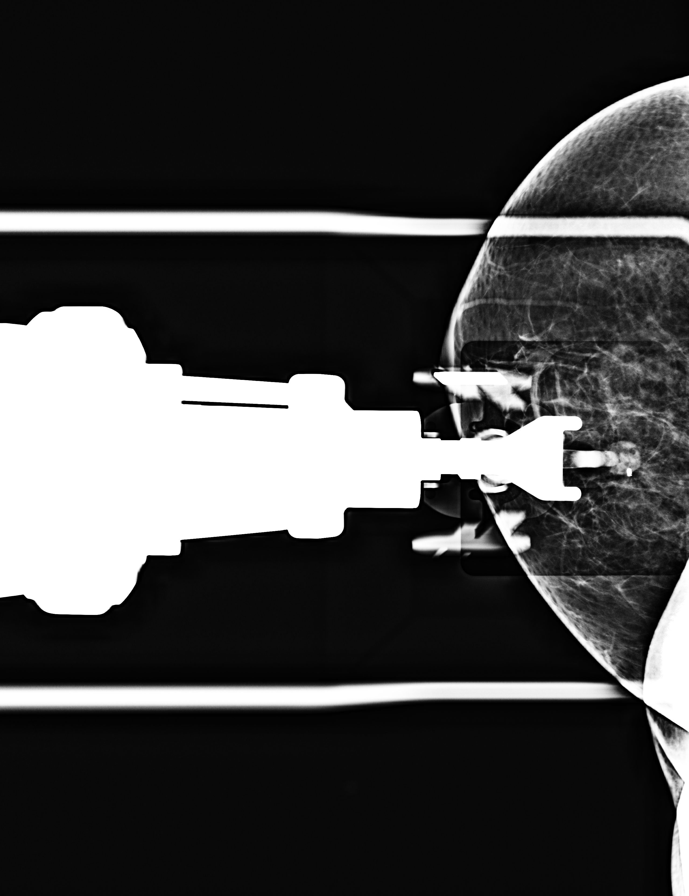

[L CC tomo · tomo slice 29/58.0]
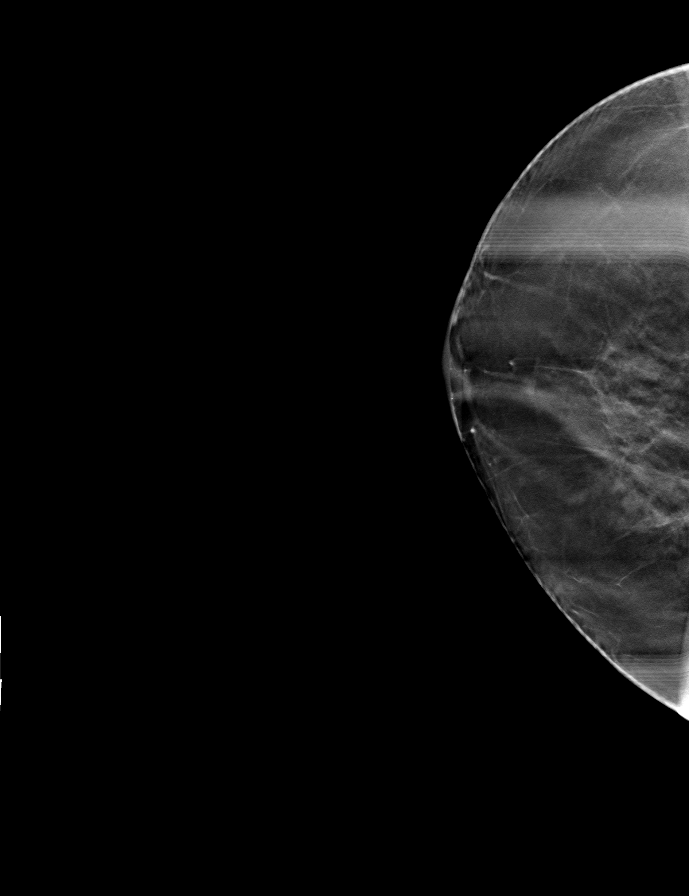

[8 of 17 positions shown; findings below may reference images not displayed]



Using sterile technique with chlorhexidine as skin antisepsis, 1%
lidocaine and 1% lidocaine with epinephrine as local anesthetic,
under stereotactic guidance, a 9 Brevera gauge vacuum assisted
device was used to perform core needle biopsy of the architectural
distortion involving the OUTER LEFT breast at MIDDLE depth using a
SUPERIOR approach. Specimen radiograph was performed showing soft
tissue and scattered microcalcifications in the core samples.

Lesion quadrant: UPPER OUTER QUADRANT.

At the conclusion of the procedure, a coil shaped tissue marker clip
was deployed into the biopsy cavity. Follow-up 2-view mammogram was
performed and dictated separately.
IMPRESSION: Stereotactic-guided biopsy of architectural distortion involving the
UPPER OUTER QUADRANT of the LEFT breast at MIDDLE depth. No apparent
complications.

## 2020-07-25 ENCOUNTER — Other Ambulatory Visit: Payer: Self-pay

## 2020-07-25 ENCOUNTER — Ambulatory Visit
Admission: RE | Admit: 2020-07-25 | Discharge: 2020-07-25 | Disposition: A | Discharge status: Home / Self Care | Payer: Medicare Other | Source: Ambulatory Visit | Attending: Oncology | Admitting: Oncology

## 2020-07-25 DIAGNOSIS — Z1231 Encounter for screening mammogram for malignant neoplasm of breast: Secondary | ICD-10-CM

## 2021-04-15 ENCOUNTER — Other Ambulatory Visit: Payer: Self-pay | Admitting: Oncology

## 2021-04-15 DIAGNOSIS — Z1231 Encounter for screening mammogram for malignant neoplasm of breast: Secondary | ICD-10-CM

## 2021-07-29 ENCOUNTER — Ambulatory Visit
Admission: RE | Admit: 2021-07-29 | Discharge: 2021-07-29 | Disposition: A | Discharge status: Home / Self Care | Payer: Medicare Other | Source: Ambulatory Visit | Attending: Oncology | Admitting: Oncology

## 2021-07-29 ENCOUNTER — Other Ambulatory Visit: Payer: Self-pay

## 2021-07-29 DIAGNOSIS — Z1231 Encounter for screening mammogram for malignant neoplasm of breast: Secondary | ICD-10-CM

## 2021-08-22 ENCOUNTER — Telehealth: Payer: Self-pay

## 2021-08-22 NOTE — Telephone Encounter (Signed)
Received referral on patient in regards to her thyroid. Patient declined at this time. Will keep referral in folder. 08/22/21

## 2021-10-07 ENCOUNTER — Other Ambulatory Visit: Payer: Self-pay | Admitting: Surgery

## 2021-10-07 DIAGNOSIS — R1312 Dysphagia, oropharyngeal phase: Secondary | ICD-10-CM

## 2021-10-07 DIAGNOSIS — E041 Nontoxic single thyroid nodule: Secondary | ICD-10-CM

## 2021-10-08 ENCOUNTER — Other Ambulatory Visit: Payer: Self-pay | Admitting: Surgery

## 2021-10-08 DIAGNOSIS — E041 Nontoxic single thyroid nodule: Secondary | ICD-10-CM

## 2021-10-18 ENCOUNTER — Ambulatory Visit
Admission: RE | Admit: 2021-10-18 | Discharge: 2021-10-18 | Disposition: A | Discharge status: Home / Self Care | Payer: Medicare Other | Source: Ambulatory Visit | Attending: Surgery | Admitting: Surgery

## 2021-10-18 DIAGNOSIS — R1312 Dysphagia, oropharyngeal phase: Secondary | ICD-10-CM

## 2021-10-18 DIAGNOSIS — E041 Nontoxic single thyroid nodule: Secondary | ICD-10-CM

## 2021-10-18 MED ORDER — IOPAMIDOL (ISOVUE-300) INJECTION 61%
75.0000 mL | Freq: Once | INTRAVENOUS | Status: AC | PRN
  Administered 2021-10-18: 75 mL via INTRAVENOUS

## 2021-10-22 ENCOUNTER — Ambulatory Visit
Admission: RE | Admit: 2021-10-22 | Discharge: 2021-10-22 | Disposition: A | Discharge status: Home / Self Care | Payer: Medicare Other | Source: Ambulatory Visit | Attending: Surgery | Admitting: Surgery

## 2021-10-22 ENCOUNTER — Other Ambulatory Visit (HOSPITAL_COMMUNITY)
Admission: RE | Admit: 2021-10-22 | Discharge: 2021-10-22 | Disposition: A | Discharge status: Home / Self Care | Payer: Medicare Other | Source: Ambulatory Visit | Attending: Surgery | Admitting: Surgery

## 2021-10-22 DIAGNOSIS — E041 Nontoxic single thyroid nodule: Secondary | ICD-10-CM | POA: Diagnosis present

## 2021-10-23 LAB — CYTOLOGY - NON PAP

## 2022-04-14 ENCOUNTER — Other Ambulatory Visit: Payer: Self-pay | Admitting: Oncology

## 2022-04-14 DIAGNOSIS — Z1231 Encounter for screening mammogram for malignant neoplasm of breast: Secondary | ICD-10-CM

## 2022-07-30 ENCOUNTER — Ambulatory Visit
Admission: RE | Admit: 2022-07-30 | Discharge: 2022-07-30 | Disposition: A | Discharge status: Home / Self Care | Payer: Medicare Other | Source: Ambulatory Visit | Attending: Oncology | Admitting: Oncology

## 2022-07-30 DIAGNOSIS — Z1231 Encounter for screening mammogram for malignant neoplasm of breast: Secondary | ICD-10-CM

## 2022-08-01 ENCOUNTER — Other Ambulatory Visit: Payer: Self-pay | Admitting: Oncology

## 2022-08-01 DIAGNOSIS — R928 Other abnormal and inconclusive findings on diagnostic imaging of breast: Secondary | ICD-10-CM

## 2022-08-12 ENCOUNTER — Ambulatory Visit
Admission: RE | Admit: 2022-08-12 | Discharge: 2022-08-12 | Disposition: A | Discharge status: Home / Self Care | Payer: Medicare Other | Source: Ambulatory Visit | Attending: Oncology | Admitting: Oncology

## 2022-08-12 ENCOUNTER — Other Ambulatory Visit: Payer: Self-pay | Admitting: Oncology

## 2022-08-12 DIAGNOSIS — N6320 Unspecified lump in the left breast, unspecified quadrant: Secondary | ICD-10-CM

## 2022-08-12 DIAGNOSIS — R928 Other abnormal and inconclusive findings on diagnostic imaging of breast: Secondary | ICD-10-CM

## 2022-08-15 ENCOUNTER — Ambulatory Visit
Admission: RE | Admit: 2022-08-15 | Discharge: 2022-08-15 | Disposition: A | Discharge status: Home / Self Care | Payer: Medicare Other | Source: Ambulatory Visit | Attending: Oncology | Admitting: Oncology

## 2022-08-15 DIAGNOSIS — N6320 Unspecified lump in the left breast, unspecified quadrant: Secondary | ICD-10-CM

## 2022-09-09 ENCOUNTER — Other Ambulatory Visit: Payer: Self-pay | Admitting: Surgery

## 2022-09-09 DIAGNOSIS — N6321 Unspecified lump in the left breast, upper outer quadrant: Secondary | ICD-10-CM

## 2022-09-12 ENCOUNTER — Other Ambulatory Visit: Payer: Self-pay | Admitting: Surgery

## 2022-09-12 DIAGNOSIS — N6321 Unspecified lump in the left breast, upper outer quadrant: Secondary | ICD-10-CM

## 2022-10-07 ENCOUNTER — Other Ambulatory Visit: Payer: Self-pay

## 2022-10-07 ENCOUNTER — Encounter (HOSPITAL_BASED_OUTPATIENT_CLINIC_OR_DEPARTMENT_OTHER): Payer: Self-pay | Admitting: Surgery

## 2022-10-13 ENCOUNTER — Ambulatory Visit
Admission: RE | Admit: 2022-10-13 | Discharge: 2022-10-13 | Disposition: A | Discharge status: Home / Self Care | Payer: Medicare Other | Source: Ambulatory Visit | Attending: Surgery | Admitting: Surgery

## 2022-10-13 ENCOUNTER — Encounter (HOSPITAL_BASED_OUTPATIENT_CLINIC_OR_DEPARTMENT_OTHER)
Admission: RE | Admit: 2022-10-13 | Discharge: 2022-10-13 | Disposition: A | Discharge status: Home / Self Care | Payer: Medicare Other | Source: Ambulatory Visit | Attending: Surgery | Admitting: Surgery

## 2022-10-13 ENCOUNTER — Other Ambulatory Visit: Payer: Self-pay | Admitting: Surgery

## 2022-10-13 ENCOUNTER — Encounter (HOSPITAL_BASED_OUTPATIENT_CLINIC_OR_DEPARTMENT_OTHER): Payer: Medicare Other | Attending: Surgery

## 2022-10-13 ENCOUNTER — Other Ambulatory Visit (HOSPITAL_BASED_OUTPATIENT_CLINIC_OR_DEPARTMENT_OTHER): Payer: Medicare Other

## 2022-10-13 DIAGNOSIS — N6321 Unspecified lump in the left breast, upper outer quadrant: Secondary | ICD-10-CM

## 2022-10-13 DIAGNOSIS — I10 Essential (primary) hypertension: Secondary | ICD-10-CM | POA: Insufficient documentation

## 2022-10-13 DIAGNOSIS — Z01810 Encounter for preprocedural cardiovascular examination: Secondary | ICD-10-CM | POA: Diagnosis present

## 2022-10-13 HISTORY — PX: BREAST BIOPSY: SHX20

## 2022-10-13 MED ORDER — ENSURE PRE-SURGERY PO LIQD: 296.0000 mL | Freq: Once | ORAL | Status: DC

## 2022-10-13 NOTE — H&P (Signed)
REFERRING PHYSICIAN: Barbee Cough, MD  PROVIDER: Beverlee Nims, MD  MRN: [id number] DOB: [birth year]  Subjective  Chief Complaint: New Problem (Left Breast )   History of Present Illness: Brittany Carr is a 76 y.o. female who is seen  as an office consultation for evaluation of New Problem (Left Breast ) .  This is a 76 year old female referred here for a small mass in her left breast. She has a prior history of a right mastectomy for breast cancer and has had a left lumpectomy in the past for benign disease. On recent screening mammography she was found to have a small mass with a distortion in the left breast. By ultrasound it measured 4 mm x 4 mm. She underwent a biopsy which showed benign breast tissue and fat necrosis. This was felt to be discordant. Repeat films and repeat biopsy versus surgical excision was recommended. The patient wishes to proceed with surgical removal of the breast mass especially given her history.  She also reports a mass on her left back which is slowly getting larger and now causing discomfort especially when trying to lie down. She reports that it been present for many years  Review of Systems: A complete review of systems was obtained from the patient. I have reviewed this information and discussed as appropriate with the patient. See HPI as well for other ROS.  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 Benign essential hypertension Cancer of central portion of left female breast (CMS-HCC) Caregiver stress Chemotherapy follow-up examination Chemotherapy induced diarrhea Generalized osteoarthrosis, involving multiple sites Hyponatremia Invasive  ductal carcinoma of breast, stage 3, right (CMS-HCC) Lesion of breast Lipoma of back Nephrolithiasis Osteopenia after menopause Port-A-Cath in place Sebaceous cyst 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  Past Surgical History: Procedure Laterality Date MASTECTOMY PARTIAL / LUMPECTOMY Bilateral 2019 CATARACT EXTRACTION Bilateral 2016 and 2017 Eye Laser Surgery Bilateral 2020   Allergies Allergen Reactions Omega-3(N-3)Polyunsaturated Fatty Acids Unknown Statins-Hmg-Coa Reductase Inhibitors Hives Sulfa (Sulfonamide Antibiotics) Other (See Comments) "make me feel weird" , did not work Makes her feel woozy Makes her feel woozy   Current Outpatient Medications on File Prior to Visit Medication Sig Dispense Refill cholecalciferol (VITAMIN D3) 2,000 unit capsule Take by mouth cyanocobalamin (VITAMIN B12) 1000 MCG tablet Take 1 tablet (1,000 mcg total) by mouth once daily latanoprost (XALATAN) 0.005 % ophthalmic solution Administer 0.005 drops to the right eye at bedtime. levothyroxine (SYNTHROID) 25 MCG tablet Take by mouth lisinopriL (ZESTRIL) 20 MG tablet Take 1 tablet (20 mg total) by mouth once daily multivitamin tablet Take 1 tablet by mouth once daily pantoprazole (PROTONIX) 20 MG DR tablet Take by mouth psyllium seed, with sugar, (FIBER ORAL) Take 1 tablet by mouth every morning before breakfast  No current facility-administered medications on file prior to visit.  History reviewed. No pertinent family history.  Social History  Tobacco Use Smoking Status Never Smokeless Tobacco Never   Social History  Socioeconomic History Marital status: Single Tobacco Use Smoking status: Never Smokeless tobacco: Never Vaping Use Vaping Use: Never used Substance and Sexual Activity Alcohol use: Not Currently Drug use: Never  Objective:  Vitals:  Weight: 76.7 kg (169 lb 3.2 oz) Height: 172.7 cm ('5\' 8"'$ )  Body mass index is 25.73  kg/m.  Physical Exam  She appears well on exam  She has a right mastectomy. There are no palpable left breast masses. The nipple areolar complex is normal. There is no axillary adenopathy.  On her left mid back toward the axilla is a soft 8 cm mass which is mobile and likely consistent with a lipoma. It is nonpulsatile and there are no skin changes  Labs, Imaging and Diagnostic Testing: I reviewed the patient's mammograms, pathology results, and her previous history  Assessment and Plan:  Left breast mass in the upper outer quadrant Left back mass  At this point I discussed with the patient regarding the breast. We discussed surgical excision of this area especially given the fact of her previous history of breast cancer and the other breast. She does wish to proceed with a radioactive seed guided left breast lumpectomy. She has had this procedure in the past on her left breast as well. She would also like the back mass removed at the same procedure which I feel is reasonable. The mass is causing symptoms and is likely a lipoma.  We discussed the risks of the procedures. These include but are not limited to bleeding, infection, injury to surrounding structures, seroma formation, the need for further procedures if malignancy is found, postoperative recovery. As she lives alone, we will likely keep her overnight from the surgery. She understands and wished to proceed

## 2022-10-13 NOTE — Progress Notes (Signed)

## 2022-10-14 ENCOUNTER — Ambulatory Visit
Admission: RE | Admit: 2022-10-14 | Discharge: 2022-10-14 | Disposition: A | Discharge status: Home / Self Care | Payer: Medicare Other | Source: Ambulatory Visit | Attending: Surgery | Admitting: Surgery

## 2022-10-14 ENCOUNTER — Ambulatory Visit (HOSPITAL_BASED_OUTPATIENT_CLINIC_OR_DEPARTMENT_OTHER): Payer: Medicare Other | Admitting: Certified Registered"

## 2022-10-14 ENCOUNTER — Other Ambulatory Visit: Payer: Self-pay

## 2022-10-14 ENCOUNTER — Ambulatory Visit (HOSPITAL_BASED_OUTPATIENT_CLINIC_OR_DEPARTMENT_OTHER)
Admission: RE | Admit: 2022-10-14 | Discharge: 2022-10-15 | Disposition: A | Discharge status: Home / Self Care | Payer: Medicare Other | Attending: Surgery | Admitting: Surgery

## 2022-10-14 ENCOUNTER — Encounter (HOSPITAL_BASED_OUTPATIENT_CLINIC_OR_DEPARTMENT_OTHER): Payer: Self-pay | Admitting: Surgery

## 2022-10-14 ENCOUNTER — Encounter (HOSPITAL_BASED_OUTPATIENT_CLINIC_OR_DEPARTMENT_OTHER)
Admission: RE | Disposition: A | Discharge status: Home / Self Care | Payer: Self-pay | Source: Home / Self Care | Attending: Surgery

## 2022-10-14 DIAGNOSIS — E039 Hypothyroidism, unspecified: Secondary | ICD-10-CM | POA: Diagnosis not present

## 2022-10-14 DIAGNOSIS — R222 Localized swelling, mass and lump, trunk: Secondary | ICD-10-CM | POA: Diagnosis not present

## 2022-10-14 DIAGNOSIS — C50912 Malignant neoplasm of unspecified site of left female breast: Secondary | ICD-10-CM

## 2022-10-14 DIAGNOSIS — C50412 Malignant neoplasm of upper-outer quadrant of left female breast: Secondary | ICD-10-CM | POA: Insufficient documentation

## 2022-10-14 DIAGNOSIS — Z79899 Other long term (current) drug therapy: Secondary | ICD-10-CM | POA: Insufficient documentation

## 2022-10-14 DIAGNOSIS — K219 Gastro-esophageal reflux disease without esophagitis: Secondary | ICD-10-CM | POA: Diagnosis not present

## 2022-10-14 DIAGNOSIS — I10 Essential (primary) hypertension: Secondary | ICD-10-CM | POA: Diagnosis not present

## 2022-10-14 DIAGNOSIS — N641 Fat necrosis of breast: Secondary | ICD-10-CM | POA: Insufficient documentation

## 2022-10-14 DIAGNOSIS — Z171 Estrogen receptor negative status [ER-]: Secondary | ICD-10-CM | POA: Insufficient documentation

## 2022-10-14 DIAGNOSIS — N6320 Unspecified lump in the left breast, unspecified quadrant: Secondary | ICD-10-CM | POA: Diagnosis present

## 2022-10-14 DIAGNOSIS — N6321 Unspecified lump in the left breast, upper outer quadrant: Secondary | ICD-10-CM

## 2022-10-14 DIAGNOSIS — Z9011 Acquired absence of right breast and nipple: Secondary | ICD-10-CM | POA: Insufficient documentation

## 2022-10-14 DIAGNOSIS — D171 Benign lipomatous neoplasm of skin and subcutaneous tissue of trunk: Secondary | ICD-10-CM | POA: Insufficient documentation

## 2022-10-14 HISTORY — PX: MASS EXCISION: SHX2000

## 2022-10-14 HISTORY — PX: BREAST LUMPECTOMY WITH RADIOACTIVE SEED LOCALIZATION: SHX6424

## 2022-10-14 SURGERY — BREAST LUMPECTOMY WITH RADIOACTIVE SEED LOCALIZATION
Anesthesia: General | Site: Breast | Laterality: Left

## 2022-10-14 MED ORDER — FENTANYL CITRATE (PF) 100 MCG/2ML IJ SOLN
INTRAMUSCULAR | Status: DC | PRN
  Administered 2022-10-14: 50 ug via INTRAVENOUS

## 2022-10-14 MED ORDER — LIDOCAINE 2% (20 MG/ML) 5 ML SYRINGE
INTRAMUSCULAR | Status: AC
  Filled 2022-10-14: qty 20

## 2022-10-14 MED ORDER — FENTANYL CITRATE (PF) 100 MCG/2ML IJ SOLN
INTRAMUSCULAR | Status: AC
  Filled 2022-10-14: qty 2

## 2022-10-14 MED ORDER — MORPHINE SULFATE (PF) 4 MG/ML IV SOLN: 1.0000 mg | INTRAVENOUS | Status: DC | PRN

## 2022-10-14 MED ORDER — OXYCODONE HCL 5 MG PO TABS: 5.0000 mg | ORAL_TABLET | ORAL | Status: DC | PRN

## 2022-10-14 MED ORDER — PHENYLEPHRINE HCL (PRESSORS) 10 MG/ML IV SOLN
INTRAVENOUS | Status: DC | PRN
  Administered 2022-10-14: 80 ug via INTRAVENOUS
  Administered 2022-10-14: 40 ug via INTRAVENOUS

## 2022-10-14 MED ORDER — LATANOPROST 0.005 % OP SOLN
1.0000 [drp] | Freq: Every day | OPHTHALMIC | Status: DC
  Administered 2022-10-14: 1 [drp] via OPHTHALMIC

## 2022-10-14 MED ORDER — PHENYLEPHRINE HCL (PRESSORS) 10 MG/ML IV SOLN
INTRAVENOUS | Status: AC
  Filled 2022-10-14: qty 1

## 2022-10-14 MED ORDER — EPHEDRINE SULFATE-NACL 50-0.9 MG/10ML-% IV SOSY
PREFILLED_SYRINGE | INTRAVENOUS | Status: DC | PRN
  Administered 2022-10-14: 5 mg via INTRAVENOUS

## 2022-10-14 MED ORDER — DEXAMETHASONE SODIUM PHOSPHATE 10 MG/ML IJ SOLN
INTRAMUSCULAR | Status: AC
  Filled 2022-10-14: qty 3

## 2022-10-14 MED ORDER — TRAMADOL HCL 50 MG PO TABS: 50.0000 mg | ORAL_TABLET | Freq: Four times a day (QID) | ORAL | Status: DC | PRN

## 2022-10-14 MED ORDER — PROPOFOL 500 MG/50ML IV EMUL
INTRAVENOUS | Status: AC
  Filled 2022-10-14: qty 50

## 2022-10-14 MED ORDER — OXYCODONE HCL 5 MG PO TABS: 5.0000 mg | ORAL_TABLET | Freq: Once | ORAL | Status: DC | PRN

## 2022-10-14 MED ORDER — LACTATED RINGERS IV SOLN: INTRAVENOUS | Status: DC

## 2022-10-14 MED ORDER — CHLORHEXIDINE GLUCONATE CLOTH 2 % EX PADS: 6.0000 | MEDICATED_PAD | Freq: Once | CUTANEOUS | Status: DC

## 2022-10-14 MED ORDER — DIPHENHYDRAMINE HCL 12.5 MG/5ML PO ELIX: 12.5000 mg | ORAL_SOLUTION | Freq: Four times a day (QID) | ORAL | Status: DC | PRN

## 2022-10-14 MED ORDER — LISINOPRIL 20 MG PO TABS
20.0000 mg | ORAL_TABLET | Freq: Every day | ORAL | Status: DC
  Administered 2022-10-14 – 2022-10-15 (×2): 20 mg via ORAL
  Filled 2022-10-14: qty 1

## 2022-10-14 MED ORDER — CEFAZOLIN SODIUM-DEXTROSE 2-4 GM/100ML-% IV SOLN
INTRAVENOUS | Status: AC
  Filled 2022-10-14: qty 100

## 2022-10-14 MED ORDER — ONDANSETRON HCL 4 MG/2ML IJ SOLN
INTRAMUSCULAR | Status: DC | PRN
  Administered 2022-10-14: 4 mg via INTRAVENOUS

## 2022-10-14 MED ORDER — BUPIVACAINE-EPINEPHRINE 0.5% -1:200000 IJ SOLN
INTRAMUSCULAR | Status: DC | PRN
  Administered 2022-10-14: 20 mL

## 2022-10-14 MED ORDER — ONDANSETRON 4 MG PO TBDP: 4.0000 mg | ORAL_TABLET | Freq: Four times a day (QID) | ORAL | Status: DC | PRN

## 2022-10-14 MED ORDER — ONDANSETRON HCL 4 MG/2ML IJ SOLN: 4.0000 mg | Freq: Four times a day (QID) | INTRAMUSCULAR | Status: DC | PRN

## 2022-10-14 MED ORDER — ONDANSETRON HCL 4 MG/2ML IJ SOLN
INTRAMUSCULAR | Status: AC
  Filled 2022-10-14: qty 16

## 2022-10-14 MED ORDER — PHENYLEPHRINE 80 MCG/ML (10ML) SYRINGE FOR IV PUSH (FOR BLOOD PRESSURE SUPPORT)
PREFILLED_SYRINGE | INTRAVENOUS | Status: AC
  Filled 2022-10-14: qty 10

## 2022-10-14 MED ORDER — FENTANYL CITRATE (PF) 100 MCG/2ML IJ SOLN: 25.0000 ug | INTRAMUSCULAR | Status: DC | PRN

## 2022-10-14 MED ORDER — DEXMEDETOMIDINE HCL IN NACL 80 MCG/20ML IV SOLN
INTRAVENOUS | Status: AC
  Filled 2022-10-14: qty 20

## 2022-10-14 MED ORDER — CEFAZOLIN SODIUM-DEXTROSE 2-4 GM/100ML-% IV SOLN
2.0000 g | INTRAVENOUS | Status: AC
  Administered 2022-10-14: 2 g via INTRAVENOUS

## 2022-10-14 MED ORDER — OXYCODONE HCL 5 MG/5ML PO SOLN: 5.0000 mg | Freq: Once | ORAL | Status: DC | PRN

## 2022-10-14 MED ORDER — PANTOPRAZOLE SODIUM 20 MG PO TBEC
20.0000 mg | DELAYED_RELEASE_TABLET | Freq: Every day | ORAL | Status: DC
  Administered 2022-10-15: 20 mg via ORAL

## 2022-10-14 MED ORDER — ACETAMINOPHEN 500 MG PO TABS
ORAL_TABLET | ORAL | Status: AC
  Filled 2022-10-14: qty 2

## 2022-10-14 MED ORDER — DEXAMETHASONE SODIUM PHOSPHATE 4 MG/ML IJ SOLN
INTRAMUSCULAR | Status: DC | PRN
  Administered 2022-10-14: 4 mg via INTRAVENOUS

## 2022-10-14 MED ORDER — ENOXAPARIN SODIUM 40 MG/0.4ML IJ SOSY: 40.0000 mg | PREFILLED_SYRINGE | INTRAMUSCULAR | Status: DC

## 2022-10-14 MED ORDER — DIPHENHYDRAMINE HCL 50 MG/ML IJ SOLN: 12.5000 mg | Freq: Four times a day (QID) | INTRAMUSCULAR | Status: DC | PRN

## 2022-10-14 MED ORDER — DEXMEDETOMIDINE HCL IN NACL 80 MCG/20ML IV SOLN
INTRAVENOUS | Status: DC | PRN
  Administered 2022-10-14: 8 ug via BUCCAL

## 2022-10-14 MED ORDER — PROPOFOL 10 MG/ML IV BOLUS
INTRAVENOUS | Status: DC | PRN
  Administered 2022-10-14: 150 mg via INTRAVENOUS

## 2022-10-14 MED ORDER — ROCURONIUM BROMIDE 10 MG/ML (PF) SYRINGE
PREFILLED_SYRINGE | INTRAVENOUS | Status: AC
  Filled 2022-10-14: qty 10

## 2022-10-14 MED ORDER — SUCCINYLCHOLINE CHLORIDE 200 MG/10ML IV SOSY
PREFILLED_SYRINGE | INTRAVENOUS | Status: AC
  Filled 2022-10-14: qty 10

## 2022-10-14 MED ORDER — ACETAMINOPHEN 500 MG PO TABS
1000.0000 mg | ORAL_TABLET | Freq: Four times a day (QID) | ORAL | Status: DC | PRN
  Administered 2022-10-14: 1000 mg via ORAL
  Filled 2022-10-14: qty 2

## 2022-10-14 MED ORDER — ACETAMINOPHEN 500 MG PO TABS
1000.0000 mg | ORAL_TABLET | ORAL | Status: AC
  Administered 2022-10-14: 1000 mg via ORAL

## 2022-10-14 MED ORDER — PROPOFOL 500 MG/50ML IV EMUL
INTRAVENOUS | Status: DC | PRN
  Administered 2022-10-14: 150 ug/kg/min via INTRAVENOUS

## 2022-10-14 MED ORDER — SODIUM CHLORIDE 0.9 % IV SOLN: INTRAVENOUS | Status: DC

## 2022-10-14 MED ORDER — ONDANSETRON HCL 4 MG/2ML IJ SOLN
INTRAMUSCULAR | Status: AC
  Filled 2022-10-14: qty 4

## 2022-10-14 MED ORDER — LIDOCAINE HCL (CARDIAC) PF 100 MG/5ML IV SOSY
PREFILLED_SYRINGE | INTRAVENOUS | Status: DC | PRN
  Administered 2022-10-14: 60 mg via INTRAVENOUS

## 2022-10-14 MED ORDER — LEVOTHYROXINE SODIUM 25 MCG PO TABS
25.0000 ug | ORAL_TABLET | Freq: Every day | ORAL | Status: DC
  Administered 2022-10-15: 25 ug via ORAL
  Filled 2022-10-14: qty 1

## 2022-10-14 SURGICAL SUPPLY — 51 items
APPLIER CLIP 9.375 MED OPEN (MISCELLANEOUS)
BINDER BREAST 3XL (GAUZE/BANDAGES/DRESSINGS) IMPLANT
BINDER BREAST LRG (GAUZE/BANDAGES/DRESSINGS) IMPLANT
BINDER BREAST MEDIUM (GAUZE/BANDAGES/DRESSINGS) IMPLANT
BINDER BREAST XLRG (GAUZE/BANDAGES/DRESSINGS) IMPLANT
BINDER BREAST XXLRG (GAUZE/BANDAGES/DRESSINGS) IMPLANT
BLADE CLIPPER SURG (BLADE) IMPLANT
BLADE SURG 15 STRL LF DISP TIS (BLADE) ×2 IMPLANT
BLADE SURG 15 STRL SS (BLADE) ×2
BNDG ELASTIC 3X5.8 VLCR STR LF (GAUZE/BANDAGES/DRESSINGS) IMPLANT
BNDG ELASTIC 4X5.8 VLCR STR LF (GAUZE/BANDAGES/DRESSINGS) IMPLANT
CANISTER SUC SOCK COL 7IN (MISCELLANEOUS) IMPLANT
CANISTER SUCT 1200ML W/VALVE (MISCELLANEOUS) IMPLANT
CHLORAPREP W/TINT 26 (MISCELLANEOUS) ×2 IMPLANT
CLIP APPLIE 9.375 MED OPEN (MISCELLANEOUS) IMPLANT
COVER BACK TABLE 60X90IN (DRAPES) ×2 IMPLANT
COVER MAYO STAND STRL (DRAPES) ×2 IMPLANT
COVER PROBE CYLINDRICAL 5X96 (MISCELLANEOUS) ×2 IMPLANT
DERMABOND ADVANCED .7 DNX12 (GAUZE/BANDAGES/DRESSINGS) ×2 IMPLANT
DRAPE LAPAROSCOPIC ABDOMINAL (DRAPES) ×2 IMPLANT
DRAPE LAPAROTOMY 100X72 PEDS (DRAPES) ×2 IMPLANT
DRAPE UTILITY XL STRL (DRAPES) ×2 IMPLANT
ELECT REM PT RETURN 9FT ADLT (ELECTROSURGICAL) ×2
ELECTRODE REM PT RTRN 9FT ADLT (ELECTROSURGICAL) ×2 IMPLANT
GAUZE SPONGE 4X4 12PLY STRL LF (GAUZE/BANDAGES/DRESSINGS) IMPLANT
GLOVE SURG SIGNA 7.5 PF LTX (GLOVE) ×2 IMPLANT
GOWN STRL REUS W/ TWL LRG LVL3 (GOWN DISPOSABLE) ×2 IMPLANT
GOWN STRL REUS W/ TWL XL LVL3 (GOWN DISPOSABLE) ×2 IMPLANT
GOWN STRL REUS W/TWL LRG LVL3 (GOWN DISPOSABLE) ×2
GOWN STRL REUS W/TWL XL LVL3 (GOWN DISPOSABLE) ×2
KIT MARKER MARGIN INK (KITS) ×2 IMPLANT
NDL HYPO 25X1 1.5 SAFETY (NEEDLE) ×2 IMPLANT
NEEDLE HYPO 25X1 1.5 SAFETY (NEEDLE) ×2 IMPLANT
NS IRRIG 1000ML POUR BTL (IV SOLUTION) IMPLANT
PACK BASIN DAY SURGERY FS (CUSTOM PROCEDURE TRAY) ×2 IMPLANT
PENCIL SMOKE EVACUATOR (MISCELLANEOUS) ×2 IMPLANT
SLEEVE SCD COMPRESS KNEE MED (STOCKING) ×2 IMPLANT
SPIKE FLUID TRANSFER (MISCELLANEOUS) IMPLANT
SPONGE T-LAP 4X18 ~~LOC~~+RFID (SPONGE) ×2 IMPLANT
SUT MNCRL AB 4-0 PS2 18 (SUTURE) ×2 IMPLANT
SUT SILK 2 0 SH (SUTURE) IMPLANT
SUT VIC AB 2-0 SH 27 (SUTURE)
SUT VIC AB 2-0 SH 27XBRD (SUTURE) IMPLANT
SUT VIC AB 3-0 SH 27 (SUTURE) ×2
SUT VIC AB 3-0 SH 27X BRD (SUTURE) ×2 IMPLANT
SYR BULB EAR ULCER 3OZ GRN STR (SYRINGE) IMPLANT
SYR CONTROL 10ML LL (SYRINGE) ×2 IMPLANT
TOWEL GREEN STERILE FF (TOWEL DISPOSABLE) ×2 IMPLANT
TRAY FAXITRON CT DISP (TRAY / TRAY PROCEDURE) ×2 IMPLANT
TUBE CONNECTING 20X1/4 (TUBING) IMPLANT
YANKAUER SUCT BULB TIP NO VENT (SUCTIONS) IMPLANT

## 2022-10-14 NOTE — Interval H&P Note (Signed)
History and Physical Interval Note: no change in H and P  10/14/2022 7:24 AM  Brittany Carr  has presented today for surgery, with the diagnosis of LEFT BREAST MASS AND BACK MASS.  The various methods of treatment have been discussed with the patient and family. After consideration of risks, benefits and other options for treatment, the patient has consented to  Procedure(s): LEFT BREAST LUMPECTOMY WITH RADIOACTIVE SEED LOCALIZATION (Left) EXCISION LEFT BACK MASS (Left) as a surgical intervention.  The patient's history has been reviewed, patient examined, no change in status, stable for surgery.  I have reviewed the patient's chart and labs.  Questions were answered to the patient's satisfaction.     Coralie Keens

## 2022-10-14 NOTE — Anesthesia Preprocedure Evaluation (Addendum)
Anesthesia Evaluation  Patient identified by MRN, date of birth, ID band Patient awake    Reviewed: Allergy & Precautions, NPO status , Patient's Chart, lab work & pertinent test results  History of Anesthesia Complications Negative for: history of anesthetic complications  Airway Mallampati: IV  TM Distance: <3 FB Neck ROM: Full    Dental  (+) Teeth Intact, Dental Advisory Given   Pulmonary neg pulmonary ROS, neg shortness of breath, neg COPD, neg recent URI   breath sounds clear to auscultation       Cardiovascular hypertension, Pt. on medications (-) angina (-) Past MI and (-) CHF  Rhythm:Regular     Neuro/Psych  Headaches  negative psych ROS   GI/Hepatic Neg liver ROS,GERD  Controlled and Medicated,,  Endo/Other  Hypothyroidism    Renal/GU negative Renal ROS     Musculoskeletal  (+) Arthritis ,    Abdominal   Peds  Hematology negative hematology ROS (+)   Anesthesia Other Findings   Reproductive/Obstetrics                             Anesthesia Physical Anesthesia Plan  ASA: 2  Anesthesia Plan: General   Post-op Pain Management: Tylenol PO (pre-op)* and Toradol IV (intra-op)*   Induction: Intravenous  PONV Risk Score and Plan: 3 and Ondansetron, Dexamethasone, Propofol infusion and TIVA  Airway Management Planned: Oral ETT and LMA  Additional Equipment: None  Intra-op Plan:   Post-operative Plan: Extubation in OR  Informed Consent: I have reviewed the patients History and Physical, chart, labs and discussed the procedure including the risks, benefits and alternatives for the proposed anesthesia with the patient or authorized representative who has indicated his/her understanding and acceptance.     Dental advisory given  Plan Discussed with: CRNA  Anesthesia Plan Comments:        Anesthesia Quick Evaluation

## 2022-10-14 NOTE — Op Note (Signed)
LEFT BREAST LUMPECTOMY WITH RADIOACTIVE SEED LOCALIZATION, EXCISION LEFT BACK MASS  Procedure Note  Brittany Carr 10/14/2022   Pre-op Diagnosis: LEFT BREAST MASS AND LEFT BACK MASS     Post-op Diagnosis: same  Procedure(s): LEFT BREAST LUMPECTOMY WITH RADIOACTIVE SEED LOCALIZATION (upper outer quadrant) EXCISION LEFT BACK MASS  (6 cm)  Surgeon(s): Coralie Keens, MD  Anesthesia: General  Staff:  Circulator: Maurene Capes, RN Scrub Person: Lorenza Burton, CST  Estimated Blood Loss: Minimal               Specimens: sent to path  Findings: The left back/flank mass measured approximate 6 cm and appeared consistent with a lipoma.  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  Biopsy was benign but it was felt to be discordant so removal the mass is recommended She also has a symptomatic left lower back/flank mass which she would like excised at the same time  Procedure: The patient was brought to the operating room identifies correct patient.  She was placed upon the operating table and general anesthesia was induced.  We then placed a bump underneath her left back and shoulder.  This brought the back mass anteriorly and into the field.  Her flank and left breast were then prepped and draped in usual sterile fashion.  I anesthetized the skin over the palpable mass with lidocaine and then made incision with a scalpel.  I then dissected down into the subcutaneous tissue with the cautery and identified a lipoma.  I excised it in its entirety with the cautery.  It measured approximately 6 cm.  It was sent to pathology for evaluation.  I then closed the subcutaneous tissue with interrupted 2-0 Vicryl sutures and closed the  skin with a running 4-0 Monocryl after anesthetizing the wound further and achieving hemostasis. Using the neoprobe identified the area of the radioactive seed in the upper outer quadrant of the left breast with the neoprobe.  I anesthetized skin with Marcaine and made a longitudinal incision with a scalpel.  I then dissected down to the breast tissue with the cautery.  With the aid of the neoprobe I then stayed around the area of uptake of the radioactive seed.  I grasped the lumpectomy area with an Allis clamp and then completely dissected around the area with the cautery until I was posterior to the radioactive seed.  I then completed the lumpectomy with the cautery.  I marked all margins with paint on the specimen.  An x-ray was performed confirming that the radioactive seed and previous biopsy clip were in the specimen.  The specimen was then sent to pathology for evaluation.  I achieved hemostasis with the cautery.  I anesthetized the incision further with Marcaine.  I then closed the subcutaneous tissue with interrupted 3-0 Vicryl sutures and closed the skin with a running 4-0 Monocryl.  Dermabond was then applied.  The patient tolerated the procedure well.  All the counts were correct at the end of the procedure.  The patient was then extubated in the operating room and taken in a stable condition to the recovery room.          Coralie Keens   Date: 10/14/2022  Time: 9:19 AM

## 2022-10-14 NOTE — Anesthesia Procedure Notes (Signed)
Procedure Name: LMA Insertion Date/Time: 10/14/2022 8:33 AM  Performed by: Staphany Ditton, Ernesta Amble, CRNAPre-anesthesia Checklist: Patient identified, Emergency Drugs available, Suction available and Patient being monitored Patient Re-evaluated:Patient Re-evaluated prior to induction Oxygen Delivery Method: Circle system utilized Preoxygenation: Pre-oxygenation with 100% oxygen Induction Type: IV induction Ventilation: Mask ventilation without difficulty LMA: LMA inserted LMA Size: 3.0 Number of attempts: 1 Airway Equipment and Method: Bite block Placement Confirmation: positive ETCO2 Tube secured with: Tape Dental Injury: Teeth and Oropharynx as per pre-operative assessment  Difficulty Due To: Difficulty was anticipated Comments: TM distance 1, narrow palate, protruding teeth, and small oral opening

## 2022-10-14 NOTE — Transfer of Care (Signed)
Immediate Anesthesia Transfer of Care Note  Patient: Brittany Carr  Procedure(s) Performed: LEFT BREAST LUMPECTOMY WITH RADIOACTIVE SEED LOCALIZATION (Left: Breast) EXCISION LEFT BACK MASS (Left: Back)  Patient Location: PACU  Anesthesia Type:General  Level of Consciousness: awake, alert , oriented, and patient cooperative  Airway & Oxygen Therapy: Patient Spontanous Breathing and Patient connected to face mask oxygen  Post-op Assessment: Report given to RN and Post -op Vital signs reviewed and stable  Post vital signs: Reviewed and stable  Last Vitals:  Vitals Value Taken Time  BP    Temp    Pulse 81 10/14/22 0923  Resp    SpO2 99 % 10/14/22 0923  Vitals shown include unvalidated device data.  Last Pain:  Vitals:   10/14/22 0715  TempSrc: Oral  PainSc: 0-No pain         Complications:  Encounter Notable Events  Notable Event Outcome Phase Comment  Difficult to intubate - expected  Intraprocedure Filed from anesthesia note documentation.

## 2022-10-15 ENCOUNTER — Encounter (HOSPITAL_BASED_OUTPATIENT_CLINIC_OR_DEPARTMENT_OTHER): Payer: Self-pay | Admitting: Surgery

## 2022-10-15 DIAGNOSIS — C50412 Malignant neoplasm of upper-outer quadrant of left female breast: Secondary | ICD-10-CM | POA: Diagnosis not present

## 2022-10-15 NOTE — Discharge Instructions (Signed)
[facility] Office Phone Number [phone]  BREAST BIOPSY/ PARTIAL MASTECTOMY: POST OP INSTRUCTIONS  Always review your discharge instruction sheet given to you by the facility where your surgery was performed.  IF YOU HAVE DISABILITY OR FAMILY LEAVE FORMS, YOU MUST BRING THEM TO THE OFFICE FOR PROCESSING.  DO NOT GIVE THEM TO YOUR DOCTOR.  A prescription for pain medication may be given to you upon discharge.  Take your pain medication as prescribed, if needed.  If narcotic pain medicine is not needed, then you may take acetaminophen (Tylenol) or ibuprofen (Advil) as needed. Take your usually prescribed medications unless otherwise directed If you need a refill on your pain medication, please contact your pharmacy.  They will contact our office to request authorization.  Prescriptions will not be filled after 5pm or on week-ends. You should eat very light the first 24 hours after surgery, such as soup, crackers, pudding, etc.  Resume your normal diet the day after surgery. Most patients will experience some swelling and bruising in the breast.  Ice packs and a good support bra will help.  Swelling and bruising can take several days to resolve.  It is common to experience some constipation if taking pain medication after surgery.  Increasing fluid intake and taking a stool softener will usually help or prevent this problem from occurring.  A mild laxative (Milk of Magnesia or Miralax) should be taken according to package directions if there are no bowel movements after 48 hours. Unless discharge instructions indicate otherwise, you may remove your bandages 24-48 hours after surgery, and you may shower at that time.  You may have steri-strips (small skin tapes) in place directly over the incision.  These strips should be left on the skin for 7-10 days.  If your surgeon used skin glue on the incision, you may shower in 24 hours.  The glue will flake off over the next 2-3 weeks.  Any  sutures or staples will be removed at the office during your follow-up visit. ACTIVITIES:  You may resume regular daily activities (gradually increasing) beginning the next day.  Wearing a good support bra or sports bra minimizes pain and swelling.  You may have sexual intercourse when it is comfortable. You may drive when you no longer are taking prescription pain medication, you can comfortably wear a seatbelt, and you can safely maneuver your car and apply brakes. RETURN TO WORK:  ______________________________________________________________________________________ You should see your doctor in the office for a follow-up appointment approximately two weeks after your surgery.  Your doctor's nurse will typically make your follow-up appointment when she calls you with your pathology report.  Expect your pathology report 2-3 business days after your surgery.  You may call to check if you do not hear from us after three days. OTHER INSTRUCTIONS: _______________________________________________________________________________________________ _____________________________________________________________________________________________________________________________________ _____________________________________________________________________________________________________________________________________ _____________________________________________________________________________________________________________________________________  WHEN TO CALL YOUR DOCTOR: Fever over 101.0 Nausea and/or vomiting. Extreme swelling or bruising. Continued bleeding from incision. Increased pain, redness, or drainage from the incision.  The clinic staff is available to answer your questions during regular business hours.  Please don't hesitate to call and ask to speak to one of the nurses for clinical concerns.  If you have a medical emergency, go to the nearest emergency room or call 911.  A surgeon from [facility] is always on call at the hospital.  For further questions, please visit [email or web address]   

## 2022-10-15 NOTE — Discharge Summary (Signed)
Physician Discharge Summary  Patient ID: Brittany Carr MRN: [id number] DOB/AGE: 02/03/1946 76 y.o.  Admit date: 10/14/2022 Discharge date: 10/15/2022  Admission Diagnoses:  Discharge Diagnoses:  Principal Problem:   Left breast mass Left back mass  Discharged Condition: good  Hospital Course: Uneventful postoperative recovery.  Discharged home postop day 1 doing well  Consults: None  Significant Diagnostic Studies:   Treatments: surgery: Left breast radioactive seed guided lumpectomy, excision 6 cm left back/flank mass  Discharge Exam: Blood pressure (!) 167/81, pulse 63, temperature 98 F (36.7 C), resp. rate 16, height 5' 7.5" (1.715 m), weight 77.4 kg, SpO2 99 %. General appearance: alert, cooperative, and no distress Resp: clear to auscultation bilaterally Cardio: regular rate and rhythm, S1, S2 normal, no murmur, click, rub or gallop Incision/Wound: breast and back incisions healing well  Disposition: Discharge disposition: 01-Home or Self Care        Allergies as of 10/15/2022       Reactions   Sulfa Antibiotics Other (See Comments)   "make me feel weird" , did not work        Medication List     TAKE these medications    cyanocobalamin 250 MCG tablet Commonly known as: VITAMIN B12 Take 250 mcg by mouth daily.   latanoprost 0.005 % ophthalmic solution Commonly known as: XALATAN Place 1 drop into the right eye at bedtime.   levothyroxine 25 MCG tablet Commonly known as: SYNTHROID Take 25 mcg by mouth daily before breakfast.   lisinopril 20 MG tablet Commonly known as: ZESTRIL Take 20 mg by mouth daily.   multivitamin with minerals Tabs tablet Take 2 tablets by mouth daily. One-A-Day Proactive 65+   niacin 500 MG tablet Commonly known as: VITAMIN B3 Take 500 mg by mouth at bedtime.   pantoprazole 20 MG tablet Commonly known as: PROTONIX Take 20 mg by mouth daily.   UNABLE TO FIND daily as needed. Med Name: Remus Loffler eye drops    UNABLE TO FIND Med Name: fiber supplement        Follow-up Information     Coralie Keens, MD Follow up in 3 week(s).   Specialty: General Surgery Contact information: [address] [place] [address] [phone]                 Signed: Coralie Keens 10/15/2022, 7:17 AM

## 2022-10-16 NOTE — Anesthesia Postprocedure Evaluation (Signed)
Anesthesia Post Note  Patient: Brittany Carr  Procedure(s) Performed: LEFT BREAST LUMPECTOMY WITH RADIOACTIVE SEED LOCALIZATION (Left: Breast) EXCISION LEFT BACK MASS (Left: Back)     Patient location during evaluation: PACU Anesthesia Type: General Level of consciousness: awake and alert Pain management: pain level controlled Vital Signs Assessment: post-procedure vital signs reviewed and stable Respiratory status: spontaneous breathing, nonlabored ventilation and respiratory function stable Cardiovascular status: blood pressure returned to baseline and stable Postop Assessment: no apparent nausea or vomiting Anesthetic complications: yes  Encounter Notable Events  Notable Event Outcome Phase Comment  Difficult to intubate - expected  Intraprocedure Filed from anesthesia note documentation.    Last Vitals:  Vitals:   10/15/22 0700 10/15/22 0800  BP:  (!) 179/78  Pulse: 84 71  Resp:  16  Temp:  36.6 C  SpO2: 99% 99%    Last Pain:  Vitals:   10/15/22 0800  TempSrc:   PainSc: 0-No pain                 Brittany Carr

## 2022-10-21 LAB — SURGICAL PATHOLOGY

## 2022-11-18 ENCOUNTER — Other Ambulatory Visit: Payer: Self-pay | Admitting: Surgery

## 2022-11-20 ENCOUNTER — Other Ambulatory Visit: Payer: Self-pay | Admitting: *Deleted

## 2022-11-20 DIAGNOSIS — C50112 Malignant neoplasm of central portion of left female breast: Secondary | ICD-10-CM

## 2022-11-21 ENCOUNTER — Telehealth: Payer: Self-pay

## 2022-11-21 NOTE — Telephone Encounter (Signed)
Spoke with pt on phone regarding baseline evals as she did not want to schedule when our scheduler had called to set up her appt.  She has had a prior right mastectomy and did well, and doesn't feel she needs to be here. Explained everything we do at baselines and our goal to be proactive to prevent problems, and our follow up after surgery. She is not a candidate for SOZO due to bilateral Breast Cancer, but could still benefit from exercises,education etc. She does not want to come. I asked her to speak with her physician about this, but also let her know that if she chooses not to come we will still be here for her if any problems arise. The baseline eval was not set up.

## 2022-11-25 ENCOUNTER — Inpatient Hospital Stay: Payer: Medicare Other | Attending: Hematology and Oncology | Admitting: Hematology and Oncology

## 2022-11-25 ENCOUNTER — Other Ambulatory Visit: Payer: Self-pay

## 2022-11-25 ENCOUNTER — Encounter: Payer: Self-pay | Admitting: Hematology and Oncology

## 2022-11-25 ENCOUNTER — Inpatient Hospital Stay: Payer: Medicare Other

## 2022-11-25 VITALS — BP 158/67 | HR 67 | Temp 97.9°F | Resp 16 | Ht 67.0 in | Wt 168.9 lb

## 2022-11-25 DIAGNOSIS — Z170 Estrogen receptor positive status [ER+]: Secondary | ICD-10-CM | POA: Insufficient documentation

## 2022-11-25 DIAGNOSIS — Z171 Estrogen receptor negative status [ER-]: Secondary | ICD-10-CM | POA: Insufficient documentation

## 2022-11-25 DIAGNOSIS — C50112 Malignant neoplasm of central portion of left female breast: Secondary | ICD-10-CM | POA: Insufficient documentation

## 2022-11-25 DIAGNOSIS — N6321 Unspecified lump in the left breast, upper outer quadrant: Secondary | ICD-10-CM | POA: Diagnosis not present

## 2022-11-25 NOTE — Progress Notes (Signed)
[facility] CONSULT NOTE  Patient Care Team: Luciano Cutter, DO as PCP - General (Family Medicine)  CHIEF COMPLAINTS/PURPOSE OF CONSULTATION:  Newly diagnosed breast cancer  HISTORY OF PRESENTING ILLNESS:  Brittany Carr 77 y.o. female is here because of recent diagnosis of left breast cancer.  I reviewed her records extensively and collaborated the history with the patient.  SUMMARY OF ONCOLOGIC HISTORY: Oncology History   No history exists.   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 She couldn't tolerate adjuvant endocrine therapy. She had unilateral mammogram, possible mass in the left breast. Left breast diagnostic mammogram new irregular mass the upper outer left breast.  Targeted ultrasound is performed, showing a mass in the left breast at 2 o'clock, 7 cm from the nipple measuring 4 x 4 mm. No axillary adenopathy.  Pathology showed Apocrine  adenocarcinoma (invasive), grade 2, 8 mm in greatest dimension, all margins negative for invasive carcinoma.  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  Prognostics showed triple negative biology, Ki 67 of 5%  She is here for a visit by herself. She Is hoping to move forward with mastectomy.  She says she doesn't want to deal with any more breast cancer if possible. Rest of the pertinent 10 point ROS reviewed and negative.  MEDICAL HISTORY:  Past Medical History:  Diagnosis Date   Abnormal mammogram of left breast 08/16/2018   Arthritis    "knees, hands" (08/16/2018)   Cancer of central portion of right female breast ([place]) 08/16/2018   Chronic headaches    "stress;  weekly" (08/16/2018)   GERD (gastroesophageal reflux disease)    History of kidney stones    Hypertension    Migraine    "a couple/year" (08/16/2018)    SURGICAL HISTORY: Past Surgical History:  Procedure Laterality Date   BREAST BIOPSY Bilateral 07/21/2018   BREAST BIOPSY Left 07/23/2018   BREAST BIOPSY Left 10/13/2022   [place] LT RADIOACTIVE SEED LOC 10/13/2022 [facility]MO[facility]   BREAST LUMPECTOMY Left 08/16/2018   LEFT BREAST LUMPECTOMY WITH RADIOACTIVE SEED LOCALIZATION   BREAST LUMPECTOMY WITH RADIOACTIVE SEED LOCALIZATION Left 08/16/2018   Procedure: LEFT BREAST LUMPECTOMY WITH RADIOACTIVE SEED LOCALIZATION;  Surgeon: Fanny Skates, MD;  Location: [place];  Service: General;  Laterality: Left;   BREAST LUMPECTOMY WITH RADIOACTIVE SEED LOCALIZATION Left 10/14/2022   Procedure: LEFT BREAST LUMPECTOMY WITH RADIOACTIVE SEED LOCALIZATION;  Surgeon: Coralie Keens, MD;  Location: [place];  Service: General;  Laterality: Left;   CATARACT EXTRACTION W/ INTRAOCULAR LENS  IMPLANT, BILATERAL Bilateral    MASS EXCISION Left 10/14/2022   Procedure: EXCISION LEFT BACK MASS;  Surgeon: Coralie Keens, MD;  Location: [place];  Service: General;  Laterality: Left;   MASTECTOMY Right 08/16/2018   WITH TARGETED AXILLARY LYMPH NODE DISSECTION   MASTECTOMY WITH AXILLARY LYMPH NODE DISSECTION Right 08/16/2018   Procedure: RIGHT TOTAL MASTECTOMY WITH TARGETED AXILLARY LYMPH NODE DISSECTION;  Surgeon: Fanny Skates, MD;  Location: [place];  Service: General;  Laterality: Right;   PORT-A-CATH REMOVAL N/A 05/16/2019   Procedure: REMOVAL PORT-A-CATH;  Surgeon: Fanny Skates, MD;  Location: [place];  Service: General;  Laterality: N/A;   PORTACATH PLACEMENT Right 10/04/2018   Procedure: INSERTION PORT-A-CATH;  Surgeon: Fanny Skates, MD;  Location: [place];  Service: General;  Laterality: Right;    SOCIAL HISTORY: Social History    Socioeconomic History   Marital status: Single    Spouse name: Not on file   Number of children: Not on file   Years of education: Not on file   Highest education level: Not on file  Occupational History   Not on file  Tobacco Use   Smoking status: Never   Smokeless tobacco: Never  Vaping Use   Vaping Use: Never used  Substance and Sexual Activity   Alcohol use: Never   Drug use: Never   Sexual activity: Not Currently  Other Topics Concern   Not on file  Social History Narrative   Has no one to drive her and stay with her DOS so she has to be [employer]   Social Determinants of Radio broadcast assistant Strain: Not on file  Food Insecurity: Not on file  Transportation Needs: Not on file  Physical Activity: Not on file  Stress: Not on file  Social Connections: Not on file  Intimate Partner Violence: Unknown (05/06/2019)   Humiliation, Afraid, Rape, and Kick questionnaire    Fear of Current or Ex-Partner: Not on file    Emotionally Abused: Not on file    Physically Abused: Not on file    Sexually Abused: Not asked    FAMILY HISTORY: No family history on file.  ALLERGIES:  is allergic to sulfa antibiotics.  MEDICATIONS:  Current Outpatient Medications  Medication Sig Dispense Refill   cyanocobalamin (VITAMIN B12) 250 MCG tablet Take 250 mcg by mouth daily.     latanoprost (XALATAN) 0.005 % ophthalmic solution Place 1 drop into the right eye at bedtime.     levothyroxine (SYNTHROID) 25 MCG tablet Take 25 mcg by mouth daily before breakfast.     lisinopril (PRINIVIL,ZESTRIL) 20 MG tablet Take 20 mg by mouth daily.  0   Multiple Vitamin (MULTIVITAMIN WITH MINERALS) TABS tablet Take 2 tablets by mouth daily. One-A-Day Proactive 65+     niacin (VITAMIN B3) 500 MG tablet Take 500 mg by mouth at bedtime.     pantoprazole (PROTONIX) 20 MG tablet Take 20 mg by mouth daily.  3   UNABLE TO FIND daily as needed. Med Name: Remus Loffler eye drops     UNABLE TO FIND Med Name:  fiber supplement     No current facility-administered medications for this visit.    REVIEW OF SYSTEMS:   Constitutional: Denies fevers, chills or abnormal night sweats Eyes: Denies blurriness of vision, double vision or watery eyes Ears, nose, mouth, throat, and face: Denies mucositis or sore throat Respiratory: Denies cough, dyspnea or wheezes Cardiovascular: Denies palpitation, chest discomfort or lower extremity swelling Gastrointestinal:  Denies nausea, heartburn or change in bowel habits Skin: Denies abnormal skin rashes Lymphatics: Denies new lymphadenopathy or easy bruising Neurological:Denies numbness, tingling or new weaknesses Behavioral/Psych: Mood is stable, no new changes  Breast: Denies any palpable lumps or discharge All other systems were reviewed with the patient and are negative.  PHYSICAL EXAMINATION: ECOG PERFORMANCE STATUS: 0 - Asymptomatic  Vitals:   11/25/22 1109  BP: (!) 158/67  Pulse: 67  Resp: 16  Temp: 97.9 F (36.6 C)  SpO2: 97%   Filed Weights   11/25/22 1109  Weight: 168 lb 14.4 oz (76.6 kg)    GENERAL:alert, no distress and  comfortable She is status post right mastectomy.  No area concerning for local recurrence.  Upon palpation of left breast, recent surgical scar noted.  No definitive palpable mass.  No regional adenopathy. No lower extremity edema  LABORATORY DATA:  I have reviewed the data as listed Lab Results  Component Value Date   WBC 8.4 09/29/2018   HGB 13.4 09/29/2018   HCT 40.3 09/29/2018   MCV 92.4 09/29/2018   PLT 320 09/29/2018   Lab Results  Component Value Date   NA 137 09/29/2018   K 4.0 09/29/2018   CL 104 09/29/2018   CO2 26 09/29/2018    RADIOGRAPHIC STUDIES: I have personally reviewed the radiological reports and agreed with the findings in the report.  ASSESSMENT AND PLAN:  Cancer of central portion of left female breast [facility]) This is a very pleasant 77 year old female patient with past medical  history significant for right breast high-grade invasive ductal carcinoma, ER/PR positive HER2 negative status postmastectomy followed by adjuvant chemotherapy.  She could not tolerate adjuvant endocrine therapy.  She most recently had a mammogram which showed a left breast mass.  She is to this postlumpectomy which showed apocrine invasive adenocarcinoma, grade 2, triple negative.  Proliferation index is at 5%.  She is hoping to proceed with mastectomy.  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  We have however not quelled on the details of chemotherapy today.  She will return to clinic after mastectomy and we will review final pathology and discuss role of adjuvant chemotherapy.  She has previously received docetaxel with cyclophosphamide.  She appears to have tolerated this well.  I will probably offer the same regimen for adjuvant chemotherapy.  Thank you for consulting [place] the care of this patient.  Please do not hesitate to contact us with any additional questions or concerns.  Total time spent: 60 minutes including history, physical exam, review of records, counseling and coordination of care All questions were answered. The patient knows to call the clinic with any problems, questions or concerns.    Benay Pike, MD 11/25/22

## 2022-11-25 NOTE — Assessment & Plan Note (Signed)
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  She could not tolerate adjuvant endocrine therapy.  She most recently had a mammogram which showed a left breast mass.  She is to this postlumpectomy which showed apocrine invasive adenocarcinoma, grade 2, triple negative.  Proliferation index is at 5%.  She is hoping to proceed with mastectomy.  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  We have however not quelled on the details of chemotherapy today.  She will return to clinic after mastectomy and we will review final pathology and discuss role of adjuvant chemotherapy.  She has previously received docetaxel with cyclophosphamide.  She appears to have tolerated this well.  I will probably offer the same regimen for adjuvant chemotherapy.  Thank you for consulting [place] the care of this patient.  Please do not hesitate to contact us with any additional questions or concerns.

## 2022-11-27 ENCOUNTER — Encounter (HOSPITAL_COMMUNITY): Payer: Self-pay

## 2022-11-28 ENCOUNTER — Encounter: Payer: Self-pay | Admitting: *Deleted

## 2022-12-03 ENCOUNTER — Encounter (HOSPITAL_BASED_OUTPATIENT_CLINIC_OR_DEPARTMENT_OTHER): Payer: Self-pay | Admitting: Surgery

## 2022-12-04 ENCOUNTER — Inpatient Hospital Stay: Payer: Medicare Other | Admitting: Licensed Clinical Social Worker

## 2022-12-04 MED ORDER — CHLORHEXIDINE GLUCONATE CLOTH 2 % EX PADS: 6.0000 | MEDICATED_PAD | Freq: Once | CUTANEOUS | Status: DC

## 2022-12-04 MED ORDER — ENSURE PRE-SURGERY PO LIQD: 296.0000 mL | Freq: Once | ORAL | Status: DC

## 2022-12-04 NOTE — Progress Notes (Signed)

## 2022-12-04 NOTE — Progress Notes (Signed)
[place] Work  Initial Assessment   Brittany Carr is a 77 y.o. year old female contacted by phone. Clinical Social Work was referred by new patient protocol for assessment of psychosocial needs.   SDOH (Social Determinants of Health) assessments performed: Yes SDOH Interventions    Flowsheet Row Clinical Support from 12/04/2022 in [place] at [facility]  SDOH Interventions   Food Insecurity Interventions Intervention Not Indicated  Housing Interventions Intervention Not Indicated  Transportation Interventions Intervention Not Indicated  Utilities Interventions Intervention Not Indicated  Financial Strain Interventions Intervention Not Indicated       SDOH Screenings   Food Insecurity: No Food Insecurity (12/04/2022)  Housing: Low Risk  (12/04/2022)  Transportation Needs: No Transportation Needs (12/04/2022)  Utilities: Not At Risk (12/04/2022)  Financial Resource Strain: Low Risk  (12/04/2022)  Tobacco Use: Low Risk  (12/03/2022)     Distress Screen completed: No     No data to display            Family/Social Information:  Housing Arrangement: patient lives alone. 80yo granddaughter lives in same building Family members/support persons in your life? Family Transportation concerns: no  Employment: Retired from the [employer].  Income source: Engineer, agricultural concerns: No Type of concern: None Food access concerns: no Religious or spiritual practice: Not known Services Currently in place:    Coping/ Adjustment to diagnosis: Patient understands treatment plan and what happens next? yes, is ready for the mastectomy and to get everything taken care of Concerns about diagnosis and/or treatment:  just wants surgery to be done and make sure nothing else comes up Current coping skills/ strengths: Ability for insight , Capable of independent living , Communication skills , and Motivation for  treatment/growth     SUMMARY: Current SDOH Barriers:  None noted today  Clinical Social Work Clinical Goal(s):  No clinical social work goals at this time  Interventions: Discussed common feeling and emotions when being diagnosed with cancer, and the importance of support during treatment Informed patient of the support team roles and support services at [facility] Provided [place] contact information and encouraged patient to call with any questions or concerns   Follow Up Plan: Patient will contact CSW with any support or resource needs Patient verbalizes understanding of plan: Yes    Brittany Topor E Parrish Bonn, LCSW

## 2022-12-09 ENCOUNTER — Encounter (HOSPITAL_BASED_OUTPATIENT_CLINIC_OR_DEPARTMENT_OTHER): Payer: Self-pay | Admitting: Surgery

## 2022-12-09 NOTE — Anesthesia Preprocedure Evaluation (Addendum)
Anesthesia Evaluation  Patient identified by MRN, date of birth, ID band Patient awake    Reviewed: Allergy & Precautions, H&P , NPO status , Patient's Chart, lab work & pertinent test results  Airway Mallampati: IV  TM Distance: >3 FB Neck ROM: Full    Dental no notable dental hx. (+) Dental Advisory Given, Teeth Intact   Pulmonary neg pulmonary ROS   Pulmonary exam normal breath sounds clear to auscultation       Cardiovascular hypertension (170/89 preop, per pt has been running high), Pt. on medications Normal cardiovascular exam Rhythm:Regular Rate:Normal     Neuro/Psych  Headaches  negative psych ROS   GI/Hepatic Neg liver ROS,GERD  Medicated,,  Endo/Other  negative endocrine ROS  Hyperlipidemia Left Breast Ca  Renal/GU negative Renal ROS  negative genitourinary   Musculoskeletal negative musculoskeletal ROS (+) Arthritis , Osteoarthritis,    Abdominal   Peds negative pediatric ROS (+)  Hematology negative hematology ROS (+)   Anesthesia Other Findings   Reproductive/Obstetrics negative OB ROS                             Anesthesia Physical Anesthesia Plan  ASA: 2  Anesthesia Plan: General   Post-op Pain Management: Regional block* and Minimal or no pain anticipated   Induction: Intravenous  PONV Risk Score and Plan: 4 or greater and Treatment may vary due to age or medical condition, Ondansetron, Dexamethasone, Propofol infusion, TIVA and Midazolam  Airway Management Planned: LMA  Additional Equipment: None  Intra-op Plan:   Post-operative Plan: Extubation in OR  Informed Consent: I have reviewed the patients History and Physical, chart, labs and discussed the procedure including the risks, benefits and alternatives for the proposed anesthesia with the patient or authorized representative who has indicated his/her understanding and acceptance.     Dental advisory  given  Plan Discussed with: CRNA  Anesthesia Plan Comments:         Anesthesia Quick Evaluation

## 2022-12-09 NOTE — H&P (Signed)
  PROVIDER: Beverlee Nims, MD  MRN: [id number] DOB: [birth year] DATE OF ENCOUNTER: 11/18/2022 Interval History:  She is here for another postoperative visit after her left breast lumpectomy for a discordant stereotactic biopsy of the left breast. This showed an invasive mammary carcinoma which was triple negative. It measured 8 mm. There was also DCIS. Again, she has a history of a right breast cancer which required a mastectomy and chemotherapy. This was done at [facility]. Since her new diagnosis, she has seen the oncologist which was a new one for her at [facility] and now would rather be seen at the [place] center. There are than a rash at her surgical site, she has been doing well  Review of Systems: A complete review of systems was obtained from the patient. I have reviewed this information and discussed as appropriate with the patient. See HPI as well for other ROS.  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 Benign essential hypertension Cancer of central portion of left female breast (CMS-HCC) Caregiver stress Chemotherapy follow-up examination Chemotherapy induced diarrhea Generalized osteoarthrosis, involving multiple sites Hyponatremia Invasive ductal carcinoma of breast, stage 3, right (CMS-HCC) Lesion of breast Lipoma of back Nephrolithiasis Osteopenia after menopause Port-A-Cath in place Sebaceous cyst 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  Past Surgical History: Procedure Laterality Date MASTECTOMY PARTIAL / LUMPECTOMY Bilateral 2019 CATARACT EXTRACTION Bilateral 2016 and 2017 [facility]  Bilateral 2020   Allergies Allergen Reactions Omega-3(N-3)Polyunsaturated Fatty Acids Unknown Statins-Hmg-Coa Reductase Inhibitors Hives Sulfa (Sulfonamide Antibiotics) Other (See Comments) "make me feel weird" , did not work Makes her feel woozy Makes her feel woozy   Current Outpatient Medications on File Prior to Visit Medication Sig Dispense Refill cholecalciferol (VITAMIN D3) 2,000 unit capsule Take by mouth cyanocobalamin (VITAMIN B12) 1000 MCG tablet Take 1 tablet (1,000 mcg total) by mouth once daily latanoprost (XALATAN) 0.005 % ophthalmic solution Administer 0.005 drops to the right eye at bedtime. levothyroxine (SYNTHROID) 25 MCG tablet Take by mouth lisinopriL (ZESTRIL) 20 MG tablet Take 1 tablet (20 mg total) by mouth once daily multivitamin tablet Take 1 tablet by mouth once daily pantoprazole (PROTONIX) 20 MG DR tablet Take by mouth psyllium seed, with sugar, (FIBER ORAL) Take 1 tablet by mouth every morning before breakfast   Physical Exam  Appears well on exam  There is still small rash at the lumpectomy site of the left breast  Assessment and Plan:   Diagnoses and all orders for this visit:  Primary invasive malignant neoplasm of left female breast (CMS-HCC) - Ambulatory Referral to Oncology-Medical    Because of her history of a mastectomy on the right side, she now wants to proceed with a left mastectomy. As this is triple negative, I will do a sentinel lymph node biopsy as well. She would also like to be referred to the cancer center at [facility] long so we will arrange this as well. I again discussed the surgical procedure with her. We discussed the risks of bleeding, infection, cardiopulmonary issues, the need for drain postoperatively, mastectomy flap necrosis, sentinel node biopsy, chronic arm swelling, etc. She understands and wished to proceed with surgery which will be scheduled as well as the appointment made with the medical oncologist.

## 2022-12-10 ENCOUNTER — Ambulatory Visit (HOSPITAL_BASED_OUTPATIENT_CLINIC_OR_DEPARTMENT_OTHER): Payer: Medicare Other | Admitting: Anesthesiology

## 2022-12-10 ENCOUNTER — Other Ambulatory Visit: Payer: Self-pay

## 2022-12-10 ENCOUNTER — Encounter (HOSPITAL_BASED_OUTPATIENT_CLINIC_OR_DEPARTMENT_OTHER): Payer: Self-pay | Admitting: Surgery

## 2022-12-10 ENCOUNTER — Ambulatory Visit (HOSPITAL_BASED_OUTPATIENT_CLINIC_OR_DEPARTMENT_OTHER)
Admission: RE | Admit: 2022-12-10 | Discharge: 2022-12-11 | Disposition: A | Discharge status: Home / Self Care | Payer: Medicare Other | Attending: Surgery | Admitting: Surgery

## 2022-12-10 ENCOUNTER — Encounter (HOSPITAL_BASED_OUTPATIENT_CLINIC_OR_DEPARTMENT_OTHER)
Admission: RE | Disposition: A | Discharge status: Home / Self Care | Payer: Self-pay | Source: Home / Self Care | Attending: Surgery

## 2022-12-10 DIAGNOSIS — Z171 Estrogen receptor negative status [ER-]: Secondary | ICD-10-CM | POA: Diagnosis not present

## 2022-12-10 DIAGNOSIS — I10 Essential (primary) hypertension: Secondary | ICD-10-CM | POA: Diagnosis not present

## 2022-12-10 DIAGNOSIS — K219 Gastro-esophageal reflux disease without esophagitis: Secondary | ICD-10-CM | POA: Diagnosis not present

## 2022-12-10 DIAGNOSIS — E785 Hyperlipidemia, unspecified: Secondary | ICD-10-CM | POA: Insufficient documentation

## 2022-12-10 DIAGNOSIS — Z01818 Encounter for other preprocedural examination: Secondary | ICD-10-CM

## 2022-12-10 DIAGNOSIS — C50912 Malignant neoplasm of unspecified site of left female breast: Secondary | ICD-10-CM | POA: Diagnosis not present

## 2022-12-10 DIAGNOSIS — C50112 Malignant neoplasm of central portion of left female breast: Secondary | ICD-10-CM | POA: Diagnosis present

## 2022-12-10 DIAGNOSIS — N6489 Other specified disorders of breast: Secondary | ICD-10-CM | POA: Insufficient documentation

## 2022-12-10 DIAGNOSIS — Z9012 Acquired absence of left breast and nipple: Secondary | ICD-10-CM

## 2022-12-10 DIAGNOSIS — N6082 Other benign mammary dysplasias of left breast: Secondary | ICD-10-CM | POA: Diagnosis not present

## 2022-12-10 HISTORY — PX: MASTECTOMY W/ SENTINEL NODE BIOPSY: SHX2001

## 2022-12-10 SURGERY — MASTECTOMY WITH SENTINEL LYMPH NODE BIOPSY
Anesthesia: General | Site: Breast | Laterality: Left

## 2022-12-10 MED ORDER — ONDANSETRON 4 MG PO TBDP: 4.0000 mg | ORAL_TABLET | Freq: Four times a day (QID) | ORAL | Status: DC | PRN

## 2022-12-10 MED ORDER — TRAMADOL HCL 50 MG PO TABS: 50.0000 mg | ORAL_TABLET | Freq: Four times a day (QID) | ORAL | Status: DC | PRN

## 2022-12-10 MED ORDER — MIDAZOLAM HCL 2 MG/2ML IJ SOLN
INTRAMUSCULAR | Status: AC
  Filled 2022-12-10: qty 2

## 2022-12-10 MED ORDER — OXYCODONE HCL 5 MG PO TABS: 5.0000 mg | ORAL_TABLET | ORAL | Status: DC | PRN

## 2022-12-10 MED ORDER — ONDANSETRON HCL 4 MG/2ML IJ SOLN: 4.0000 mg | Freq: Four times a day (QID) | INTRAMUSCULAR | Status: DC | PRN

## 2022-12-10 MED ORDER — BUPIVACAINE LIPOSOME 1.3 % IJ SUSP
INTRAMUSCULAR | Status: DC | PRN
  Administered 2022-12-10: 10 mL via PERINEURAL

## 2022-12-10 MED ORDER — FENTANYL CITRATE (PF) 100 MCG/2ML IJ SOLN
100.0000 ug | Freq: Once | INTRAMUSCULAR | Status: AC
  Administered 2022-12-10: 50 ug via INTRAVENOUS

## 2022-12-10 MED ORDER — LACTATED RINGERS IV SOLN: INTRAVENOUS | Status: DC

## 2022-12-10 MED ORDER — BUPIVACAINE-EPINEPHRINE (PF) 0.5% -1:200000 IJ SOLN
INTRAMUSCULAR | Status: AC
  Filled 2022-12-10: qty 30

## 2022-12-10 MED ORDER — PROPOFOL 500 MG/50ML IV EMUL
INTRAVENOUS | Status: DC | PRN
  Administered 2022-12-10: 150 ug/kg/min via INTRAVENOUS

## 2022-12-10 MED ORDER — ONDANSETRON HCL 4 MG/2ML IJ SOLN
INTRAMUSCULAR | Status: DC | PRN
  Administered 2022-12-10: 4 mg via INTRAVENOUS

## 2022-12-10 MED ORDER — LEVOTHYROXINE SODIUM 25 MCG PO TABS
25.0000 ug | ORAL_TABLET | Freq: Every day | ORAL | Status: DC
  Administered 2022-12-11: 25 ug via ORAL

## 2022-12-10 MED ORDER — LISINOPRIL 20 MG PO TABS: 20.0000 mg | ORAL_TABLET | Freq: Every day | ORAL | Status: DC

## 2022-12-10 MED ORDER — PHENYLEPHRINE HCL (PRESSORS) 10 MG/ML IV SOLN
INTRAVENOUS | Status: DC | PRN
  Administered 2022-12-10: 160 ug via INTRAVENOUS
  Administered 2022-12-10: 80 ug via INTRAVENOUS

## 2022-12-10 MED ORDER — ONDANSETRON HCL 4 MG/2ML IJ SOLN
INTRAMUSCULAR | Status: AC
  Filled 2022-12-10: qty 2

## 2022-12-10 MED ORDER — HYDROMORPHONE HCL 1 MG/ML IJ SOLN: 1.0000 mg | INTRAMUSCULAR | Status: DC | PRN

## 2022-12-10 MED ORDER — OXYCODONE HCL 5 MG PO TABS
5.0000 mg | ORAL_TABLET | Freq: Once | ORAL | Status: AC | PRN
  Administered 2022-12-10: 5 mg via ORAL

## 2022-12-10 MED ORDER — ACETAMINOPHEN 500 MG PO TABS
1000.0000 mg | ORAL_TABLET | Freq: Four times a day (QID) | ORAL | Status: DC
  Administered 2022-12-10 – 2022-12-11 (×3): 1000 mg via ORAL
  Filled 2022-12-10 (×3): qty 2

## 2022-12-10 MED ORDER — PROPOFOL 10 MG/ML IV BOLUS
INTRAVENOUS | Status: DC | PRN
  Administered 2022-12-10: 150 mg via INTRAVENOUS

## 2022-12-10 MED ORDER — OXYCODONE HCL 5 MG PO TABS
ORAL_TABLET | ORAL | Status: AC
  Filled 2022-12-10: qty 1

## 2022-12-10 MED ORDER — DIPHENHYDRAMINE HCL 12.5 MG/5ML PO ELIX: 12.5000 mg | ORAL_SOLUTION | Freq: Four times a day (QID) | ORAL | Status: DC | PRN

## 2022-12-10 MED ORDER — CEFAZOLIN SODIUM-DEXTROSE 2-4 GM/100ML-% IV SOLN
INTRAVENOUS | Status: AC
  Filled 2022-12-10: qty 100

## 2022-12-10 MED ORDER — HYDRALAZINE HCL 20 MG/ML IJ SOLN
5.0000 mg | INTRAMUSCULAR | Status: DC | PRN
  Administered 2022-12-10 (×2): 5 mg via INTRAVENOUS

## 2022-12-10 MED ORDER — ACETAMINOPHEN 500 MG PO TABS
1000.0000 mg | ORAL_TABLET | ORAL | Status: AC
  Administered 2022-12-10: 1000 mg via ORAL

## 2022-12-10 MED ORDER — FENTANYL CITRATE (PF) 100 MCG/2ML IJ SOLN
INTRAMUSCULAR | Status: AC
  Filled 2022-12-10: qty 2

## 2022-12-10 MED ORDER — PHENYLEPHRINE 80 MCG/ML (10ML) SYRINGE FOR IV PUSH (FOR BLOOD PRESSURE SUPPORT)
PREFILLED_SYRINGE | INTRAVENOUS | Status: AC
  Filled 2022-12-10: qty 10

## 2022-12-10 MED ORDER — LIDOCAINE 2% (20 MG/ML) 5 ML SYRINGE
INTRAMUSCULAR | Status: AC
  Filled 2022-12-10: qty 5

## 2022-12-10 MED ORDER — FENTANYL CITRATE (PF) 100 MCG/2ML IJ SOLN
INTRAMUSCULAR | Status: DC | PRN
  Administered 2022-12-10 (×4): 25 ug via INTRAVENOUS

## 2022-12-10 MED ORDER — LATANOPROST 0.005 % OP SOLN
1.0000 [drp] | Freq: Every day | OPHTHALMIC | Status: DC
  Administered 2022-12-10: 1 [drp] via OPHTHALMIC

## 2022-12-10 MED ORDER — CEFAZOLIN SODIUM-DEXTROSE 2-4 GM/100ML-% IV SOLN
2.0000 g | INTRAVENOUS | Status: AC
  Administered 2022-12-10: 2 g via INTRAVENOUS

## 2022-12-10 MED ORDER — DEXAMETHASONE SODIUM PHOSPHATE 10 MG/ML IJ SOLN
INTRAMUSCULAR | Status: AC
  Filled 2022-12-10: qty 1

## 2022-12-10 MED ORDER — ENOXAPARIN SODIUM 40 MG/0.4ML IJ SOSY: 40.0000 mg | PREFILLED_SYRINGE | INTRAMUSCULAR | Status: DC

## 2022-12-10 MED ORDER — MIDAZOLAM HCL 2 MG/2ML IJ SOLN
2.0000 mg | Freq: Once | INTRAMUSCULAR | Status: AC
  Administered 2022-12-10: 1 mg via INTRAVENOUS

## 2022-12-10 MED ORDER — MAGTRACE LYMPHATIC TRACER
INTRAMUSCULAR | Status: DC | PRN
  Administered 2022-12-10: 2 mL via INTRAMUSCULAR

## 2022-12-10 MED ORDER — LISINOPRIL 40 MG PO TABS: 40.0000 mg | ORAL_TABLET | Freq: Every day | ORAL | Status: DC

## 2022-12-10 MED ORDER — ONDANSETRON HCL 4 MG/2ML IJ SOLN: 4.0000 mg | Freq: Once | INTRAMUSCULAR | Status: DC | PRN

## 2022-12-10 MED ORDER — DEXAMETHASONE SODIUM PHOSPHATE 10 MG/ML IJ SOLN
INTRAMUSCULAR | Status: DC | PRN
  Administered 2022-12-10: 10 mg via INTRAVENOUS

## 2022-12-10 MED ORDER — PANTOPRAZOLE SODIUM 20 MG PO TBEC: 20.0000 mg | DELAYED_RELEASE_TABLET | Freq: Every day | ORAL | Status: DC

## 2022-12-10 MED ORDER — HYDRALAZINE HCL 20 MG/ML IJ SOLN
INTRAMUSCULAR | Status: AC
  Filled 2022-12-10: qty 1

## 2022-12-10 MED ORDER — LIDOCAINE HCL (CARDIAC) PF 100 MG/5ML IV SOSY
PREFILLED_SYRINGE | INTRAVENOUS | Status: DC | PRN
  Administered 2022-12-10: 40 mg via INTRATRACHEAL

## 2022-12-10 MED ORDER — ROPIVACAINE HCL 5 MG/ML IJ SOLN
INTRAMUSCULAR | Status: DC | PRN
  Administered 2022-12-10: 20 mL via PERINEURAL

## 2022-12-10 MED ORDER — SODIUM CHLORIDE 0.9 % IV SOLN: INTRAVENOUS | Status: DC

## 2022-12-10 MED ORDER — PROPOFOL 10 MG/ML IV BOLUS
INTRAVENOUS | Status: AC
  Filled 2022-12-10: qty 20

## 2022-12-10 MED ORDER — DIPHENHYDRAMINE HCL 50 MG/ML IJ SOLN: 12.5000 mg | Freq: Four times a day (QID) | INTRAMUSCULAR | Status: DC | PRN

## 2022-12-10 MED ORDER — ACETAMINOPHEN 500 MG PO TABS
ORAL_TABLET | ORAL | Status: AC
  Filled 2022-12-10: qty 2

## 2022-12-10 MED ORDER — FENTANYL CITRATE (PF) 100 MCG/2ML IJ SOLN
25.0000 ug | INTRAMUSCULAR | Status: DC | PRN
  Administered 2022-12-10: 25 ug via INTRAVENOUS

## 2022-12-10 MED ORDER — OXYCODONE HCL 5 MG/5ML PO SOLN: 5.0000 mg | Freq: Once | ORAL | Status: AC | PRN

## 2022-12-10 SURGICAL SUPPLY — 54 items
APPLIER CLIP 9.375 MED OPEN (MISCELLANEOUS) ×1
BINDER BREAST LRG (GAUZE/BANDAGES/DRESSINGS) IMPLANT
BIOPATCH RED 1 DISK 7.0 (GAUZE/BANDAGES/DRESSINGS) IMPLANT
BLADE CLIPPER SURG (BLADE) IMPLANT
BLADE SURG 15 STRL LF DISP TIS (BLADE) ×1 IMPLANT
BLADE SURG 15 STRL SS (BLADE) ×1
CANISTER SUCT 1200ML W/VALVE (MISCELLANEOUS) ×1 IMPLANT
CHLORAPREP W/TINT 26 (MISCELLANEOUS) ×1 IMPLANT
CLIP APPLIE 9.375 MED OPEN (MISCELLANEOUS) IMPLANT
COVER BACK TABLE 60X90IN (DRAPES) ×1 IMPLANT
COVER MAYO STAND STRL (DRAPES) ×1 IMPLANT
COVER PROBE CYLINDRICAL 5X96 (MISCELLANEOUS) ×1 IMPLANT
DERMABOND ADVANCED .7 DNX12 (GAUZE/BANDAGES/DRESSINGS) ×1 IMPLANT
DRAIN CHANNEL 19F RND (DRAIN) ×1 IMPLANT
DRAPE LAPAROSCOPIC ABDOMINAL (DRAPES) ×1 IMPLANT
DRAPE UTILITY XL STRL (DRAPES) ×1 IMPLANT
DRSG TEGADERM 2-3/8X2-3/4 SM (GAUZE/BANDAGES/DRESSINGS) IMPLANT
ELECT REM PT RETURN 9FT ADLT (ELECTROSURGICAL) ×1
ELECTRODE REM PT RTRN 9FT ADLT (ELECTROSURGICAL) ×1 IMPLANT
EVACUATOR SILICONE 100CC (DRAIN) ×1 IMPLANT
GAUZE SPONGE 4X4 12PLY STRL LF (GAUZE/BANDAGES/DRESSINGS) IMPLANT
GLOVE BIOGEL PI IND STRL 7.5 (GLOVE) IMPLANT
GLOVE SURG SIGNA 7.5 PF LTX (GLOVE) ×1 IMPLANT
GLOVE SURG SYN 7.5  E (GLOVE) ×1
GLOVE SURG SYN 7.5 E (GLOVE) ×1 IMPLANT
GLOVE SURG SYN 7.5 PF PI (GLOVE) IMPLANT
GOWN STRL REUS W/ TWL LRG LVL3 (GOWN DISPOSABLE) ×1 IMPLANT
GOWN STRL REUS W/ TWL XL LVL3 (GOWN DISPOSABLE) ×1 IMPLANT
GOWN STRL REUS W/TWL LRG LVL3 (GOWN DISPOSABLE) ×1
GOWN STRL REUS W/TWL XL LVL3 (GOWN DISPOSABLE) ×1
HEMOSTAT SURGICEL 2X14 (HEMOSTASIS) IMPLANT
LIGHT WAVEGUIDE WIDE FLAT (MISCELLANEOUS) ×1 IMPLANT
NDL HYPO 25X1 1.5 SAFETY (NEEDLE) ×2 IMPLANT
NDL SAFETY ECLIP 18X1.5 (MISCELLANEOUS) ×1 IMPLANT
NEEDLE HYPO 25X1 1.5 SAFETY (NEEDLE) ×2 IMPLANT
NS IRRIG 1000ML POUR BTL (IV SOLUTION) ×1 IMPLANT
PACK BASIN DAY SURGERY FS (CUSTOM PROCEDURE TRAY) ×1 IMPLANT
PENCIL SMOKE EVACUATOR (MISCELLANEOUS) ×1 IMPLANT
PIN SAFETY STERILE (MISCELLANEOUS) ×1 IMPLANT
SLEEVE SCD COMPRESS KNEE MED (STOCKING) ×1 IMPLANT
SPIKE FLUID TRANSFER (MISCELLANEOUS) IMPLANT
SPONGE T-LAP 18X18 ~~LOC~~+RFID (SPONGE) ×1 IMPLANT
STRIP CLOSURE SKIN 1/2X4 (GAUZE/BANDAGES/DRESSINGS) IMPLANT
SUT ETHILON 3 0 PS 1 (SUTURE) ×1 IMPLANT
SUT MNCRL AB 4-0 PS2 18 (SUTURE) ×1 IMPLANT
SUT SILK 3 0 PS 1 (SUTURE) IMPLANT
SUT VIC AB 3-0 SH 27 (SUTURE) ×2
SUT VIC AB 3-0 SH 27X BRD (SUTURE) ×1 IMPLANT
SYR BULB EAR ULCER 3OZ GRN STR (SYRINGE) ×1 IMPLANT
SYR CONTROL 10ML LL (SYRINGE) ×2 IMPLANT
TOWEL GREEN STERILE FF (TOWEL DISPOSABLE) ×1 IMPLANT
TRACER MAGTRACE VIAL (MISCELLANEOUS) IMPLANT
TUBE CONNECTING 20X1/4 (TUBING) ×1 IMPLANT
YANKAUER SUCT BULB TIP NO VENT (SUCTIONS) ×1 IMPLANT

## 2022-12-10 NOTE — Anesthesia Procedure Notes (Signed)
Procedure Name: LMA Insertion Date/Time: 12/10/2022 7:27 AM  Performed by: Glory Buff, CRNAPre-anesthesia Checklist: Patient identified, Emergency Drugs available, Patient being monitored and Suction available Patient Re-evaluated:Patient Re-evaluated prior to induction Oxygen Delivery Method: Circle system utilized Preoxygenation: Pre-oxygenation with 100% oxygen Induction Type: IV induction LMA: LMA inserted LMA Size: 3.0 Number of attempts: 1 Placement Confirmation: positive ETCO2 Tube secured with: Tape Dental Injury: Teeth and Oropharynx as per pre-operative assessment

## 2022-12-10 NOTE — Anesthesia Procedure Notes (Signed)
Anesthesia Regional Block: Pectoralis block   Pre-Anesthetic Checklist: , timeout performed,  Correct Patient, Correct Site, Correct Laterality,  Correct Procedure, Correct Position, site marked,  Risks and benefits discussed,  Surgical consent,  Pre-op evaluation,  At surgeon's request and post-op pain management  Laterality: Left  Prep: Maximum Sterile Barrier Precautions used, chloraprep       Needles:  Injection technique: Single-shot  Needle Type: Echogenic Stimulator Needle     Needle Length: 9cm  Needle Gauge: 22     Additional Needles:   Procedures:,,,, ultrasound used (permanent image in chart),,    Narrative:  Start time: 12/10/2022 7:00 AM End time: 12/10/2022 7:05 AM Injection made incrementally with aspirations every 5 mL.  Performed by: Personally  Anesthesiologist: Pervis Hocking, DO  Additional Notes: Monitors applied. No increased pain on injection. No increased resistance to injection. Injection made in 5cc increments. Good needle visualization. Patient tolerated procedure well.

## 2022-12-10 NOTE — Op Note (Signed)
   Brittany Carr 12/10/2022   Pre-op Diagnosis: LEFT BREAST CANCER     Post-op Diagnosis: same  Procedure(s): LEFT MASTECTOMY WITH DEEP LEFT AXILLARY SENTINEL LYMPH NODE BIOPSY INJECTION OF MAGTRACE FOR LYMPH NODE MAPPING  Surgeon(s): Coralie Keens, MD  Anesthesia: General  Staff:  Circulator: Merwyn Katos, RN Scrub Person: Buddy Duty A  Estimated Blood Loss: Minimal               Specimens: sent to path  Indications: This is a 77 year old female with a previous history of a right breast cancer status postmastectomy who was found to have a suspicious area in her left breast and screen mammography.  An initial biopsy was benign but was felt to be discordant so she presented for a lumpectomy.  She was found to have a triple negative left breast cancer.  The patient decided to proceed with a left mastectomy and sentinel node biopsy  Procedure: The patient was brought to the operating identified as correct patient.  She was placed upon the operating table and general anesthesia was induced.  I next injected MAC trace underneath the left nipple areolar complex and massaged the breast.  Her left breast and axilla were then prepped and draped in usual sterile fashion.  I next made an elliptical incision with a scalpel around the nipple areolar complex moving from medial to lateral incorporating the lumpectomy site in the lateral breast.  I then dissected down circumferentially to the breast tissue with electrocautery.  I next dissected the superior skin flap staying just underneath the dermis going superiorly and then down to the chest wall.  I then dissected the inferior skin flap in a likewise fashion going down the inframammary ridge.  I then took the dissection laterally toward the axilla with both skin flaps.  I next dissected the breast tissue off of the pectoralis muscle moving medial to lateral with electrocautery.  Once I passed to the pectoralis muscle I then completed the  total mastectomy.  I marked the lateral margin at the skin with a silk suture. Using the mag trace probe I then identified areas of increased uptake in the axilla.  She had some palpable visible lymph nodes.  I excised the sentinel lymph nodes and the larger nodes that were palpable with electrocautery and surgical clips.  The nodes were sent to pathology for evaluation.  I achieved hemostasis with the cautery.  We irrigated the mastectomy site with saline.  I made a separate skin incision and placed a 77 French Blake drain into the mastectomy site including the axilla.  This was sewn in place with a nylon suture.  I then closed the subcutaneous tissue with interrupted 3-0 Vicryl sutures and closed the skin with a running 4-0 Monocryl.  Steri-Strips were then applied.  Gauze and a breast binder were then applied as well.  The drain was placed to bulb suction.  The patient tolerated the procedure well.  All the counts were correct at the end of the procedure.  The patient was then extubated in the operating room and taken in a stable condition to the recovery room.            Coralie Keens   Date: 12/10/2022  Time: 8:30 AM

## 2022-12-10 NOTE — Anesthesia Postprocedure Evaluation (Signed)
Anesthesia Post Note  Patient: Ralph Benavidez  Procedure(s) Performed: LEFT MASTECTOMY WITH SENTINEL LYMPH NODE BIOPSY (Left: Breast)     Patient location during evaluation: PACU Anesthesia Type: General and Regional Level of consciousness: awake and alert, oriented and patient cooperative Pain management: pain level controlled Vital Signs Assessment: post-procedure vital signs reviewed and stable Respiratory status: spontaneous breathing, nonlabored ventilation and respiratory function stable Cardiovascular status: blood pressure returned to baseline and stable Postop Assessment: no apparent nausea or vomiting Anesthetic complications: no Comments: Hypertensive at baseline, per pt has a plan with her PCP to double her BP meds in the next week. Advised her to start taking the higher dose today   No notable events documented.  Last Vitals:  Vitals:   12/10/22 0915 12/10/22 0930  BP: (!) 204/77 (!) 194/86  Pulse: 65 60  Resp: 20 13  Temp:    SpO2: 95% 97%    Last Pain:  Vitals:   12/10/22 0930  TempSrc:   PainSc: 2    Pain Goal: Patients Stated Pain Goal: 2 (12/10/22 0930)                 Pervis Hocking

## 2022-12-10 NOTE — Transfer of Care (Signed)
Immediate Anesthesia Transfer of Care Note  Patient: Brittany Carr  Procedure(s) Performed: LEFT MASTECTOMY WITH SENTINEL LYMPH NODE BIOPSY (Left: Breast)  Patient Location: PACU  Anesthesia Type:GA combined with regional for post-op pain  Level of Consciousness: drowsy, patient cooperative, and responds to stimulation  Airway & Oxygen Therapy: Patient Spontanous Breathing and Patient connected to face mask oxygen  Post-op Assessment: Report given to RN and Post -op Vital signs reviewed and stable  Post vital signs: Reviewed and stable  Last Vitals:  Vitals Value Taken Time  BP    Temp    Pulse 72 12/10/22 0834  Resp    SpO2 98 % 12/10/22 0834  Vitals shown include unvalidated device data.  Last Pain:  Vitals:   12/10/22 1886  TempSrc: Oral  PainSc: 0-No pain      Patients Stated Pain Goal: 4 (77/37/36 6815)  Complications: No notable events documented.

## 2022-12-10 NOTE — Interval H&P Note (Signed)
History and Physical Interval Note: no change in H and P  12/10/2022 7:04 AM  Brittany Carr  has presented today for surgery, with the diagnosis of LEFT BREAST CANCER.  The various methods of treatment have been discussed with the patient and family. After consideration of risks, benefits and other options for treatment, the patient has consented to  Procedure(s): LEFT MASTECTOMY WITH SENTINEL LYMPH NODE BIOPSY (Left) as a surgical intervention.  The patient's history has been reviewed, patient examined, no change in status, stable for surgery.  I have reviewed the patient's chart and labs.  Questions were answered to the patient's satisfaction.     Coralie Keens

## 2022-12-10 NOTE — Progress Notes (Signed)
Assisted Dr. Finucane with left, pectoralis, ultrasound guided block. Side rails up, monitors on throughout procedure. See vital signs in flow sheet. Tolerated Procedure well. 

## 2022-12-11 ENCOUNTER — Encounter (HOSPITAL_BASED_OUTPATIENT_CLINIC_OR_DEPARTMENT_OTHER): Payer: Self-pay | Admitting: Surgery

## 2022-12-11 DIAGNOSIS — C50112 Malignant neoplasm of central portion of left female breast: Secondary | ICD-10-CM | POA: Diagnosis not present

## 2022-12-11 NOTE — Discharge Summary (Signed)
Physician Discharge Summary  Patient ID: Dajahnae Vondra MRN: [id number] DOB/AGE: 1946-11-06 77 y.o.  Admit date: 12/10/2022 Discharge date: 12/11/2022  Admission Diagnoses:  Discharge Diagnoses:  Principal Problem:   S/P left mastectomy Left breast cancer  Discharged Condition: good  Hospital Course: uneventful post op recovery Discharged home POD#1  Consults: None  Significant Diagnostic Studies:   Treatments: surgery: left total mastectomy with sentinel node biopsy  Discharge Exam: Blood pressure (!) 158/69, pulse 70, temperature 98.2 F (36.8 C), resp. rate 16, height '5\' 8"'$  (1.727 m), weight 77.8 kg, SpO2 96 %. General appearance: alert, cooperative, and no distress Resp: clear to auscultation bilaterally Cardio: regular rate and rhythm, S1, S2 normal, no murmur, click, rub or gallop Incision/Wound:left mastectomy site clean, flaps viable, drain serosang  Disposition: Discharge disposition: 01-Home or Self Care       Discharge Instructions     Diet - low sodium heart healthy   Complete by: As directed    Increase activity slowly   Complete by: As directed       Allergies as of 12/11/2022       Reactions   Sulfa Antibiotics Other (See Comments)   "make me feel weird" , did not work        Medication List     TAKE these medications    cyanocobalamin 250 MCG tablet Commonly known as: VITAMIN B12 Take 250 mcg by mouth daily.   latanoprost 0.005 % ophthalmic solution Commonly known as: XALATAN Place 1 drop into the right eye at bedtime.   levothyroxine 25 MCG tablet Commonly known as: SYNTHROID Take 25 mcg by mouth daily before breakfast.   lisinopril 20 MG tablet Commonly known as: ZESTRIL Take 20 mg by mouth daily.   multivitamin with minerals Tabs tablet Take 2 tablets by mouth daily. One-A-Day Proactive 65+   pantoprazole 20 MG tablet Commonly known as: PROTONIX Take 20 mg by mouth daily.   UNABLE TO FIND daily as needed. Med Name:  Remus Loffler eye drops   UNABLE TO FIND Med Name: fiber supplement   Vitamin D-3 25 MCG (1000 UT) Caps Take by mouth.        Follow-up Information     Coralie Keens, MD. Call on 12/29/2022.   Specialty: General Surgery Why: call for time Contact information: [address] [place] [address] [phone]                 Signed: Coralie Keens 12/11/2022, 7:34 AM

## 2022-12-11 NOTE — Discharge Instructions (Addendum)
[facility] [phone]  MASTECTOMY: POST OP INSTRUCTIONS  Always review your discharge instruction sheet given to you by the facility where your surgery was performed. IF YOU HAVE DISABILITY OR FAMILY LEAVE FORMS, YOU MUST BRING THEM TO THE OFFICE FOR PROCESSING.   DO NOT GIVE THEM TO YOUR DOCTOR. A prescription for pain medication may be given to you upon discharge.  Take your pain medication as prescribed, if needed.  If narcotic pain medicine is not needed, then you may take acetaminophen (Tylenol) or ibuprofen (Advil) as needed. Take your usually prescribed medications unless otherwise directed. If you need a refill on your pain medication, please contact your pharmacy.  They will contact our office to request authorization.  Prescriptions will not be filled after 5pm or on week-ends. You should follow a light diet the first few days after arrival home, such as soup and crackers, etc.  Resume your normal diet the day after surgery. Most patients will experience some swelling and bruising on the chest and underarm.  Ice packs will help.  Swelling and bruising can take several days to resolve.  It is common to experience some constipation if taking pain medication after surgery.  Increasing fluid intake and taking a stool softener (such as Colace) will usually help or prevent this problem from occurring.  A mild laxative (Milk of Magnesia or Miralax) should be taken according to package instructions if there are no bowel movements after 48 hours. Unless discharge instructions indicate otherwise, leave your bandage dry and in place until your next appointment in 3-5 days.  You may take a limited sponge bath.  No tube baths or showers until the drains are removed.  You may have steri-strips (small skin tapes) in place directly over the incision.  These strips should be left on the skin for 7-10 days.  If your surgeon used skin glue on the incision, you may shower in 24 hours.   The glue will flake off over the next 2-3 weeks.  Any sutures or staples will be removed at the office during your follow-up visit. DRAINS:  If you have drains in place, it is important to keep a list of the amount of drainage produced each day in your drains.  Before leaving the hospital, you should be instructed on drain care.  Call our office if you have any questions about your drains. ACTIVITIES:  You may resume regular (light) daily activities beginning the next day--such as daily self-care, walking, climbing stairs--gradually increasing activities as tolerated.  You may have sexual intercourse when it is comfortable.  Refrain from any heavy lifting or straining until approved by your doctor. You may drive when you are no longer taking prescription pain medication, you can comfortably wear a seatbelt, and you can safely maneuver your car and apply brakes. RETURN TO WORK:  __________________________________________________________ Dennis Bast should see your doctor in the office for a follow-up appointment approximately 3-5 days after your surgery.  Your doctor's nurse will typically make your follow-up appointment when she calls you with your pathology report.  Expect your pathology report 2-3 business days after your surgery.  You may call to check if you do not hear from [place] after three days.   OTHER INSTRUCTIONS: YOU MAY SHOWER STARTING TODAY______________________________________________________________________________________________ ____________________________________________________________________________________________ WHEN TO CALL YOUR DOCTOR: Fever over 101.0 Nausea and/or vomiting Extreme swelling or bruising Continued bleeding from incision. Increased pain, redness, or drainage from the incision. The clinic staff is available to answer your questions during regular business hours.  Please don't hesitate to call and ask to speak to one of the nurses for clinical concerns.  If you have a  medical emergency, go to the nearest emergency room or call 911.  A surgeon from [facility] is always on call at the hospital. [address], [place], [place], [place]  [address] ? [address] [place], [place], [place]   [address] [phone] ? [phone] ? FAX [phone] Web site: [email or web address]  About my Jackson-Pratt Bulb Drain  What is a Jackson-Pratt bulb? A Jackson-Pratt is a soft, round device used to collect drainage. It is connected to a long, thin drainage catheter, which is held in place by one or two small stiches near your surgical incision site. When the bulb is squeezed, it forms a vacuum, forcing the drainage to empty into the bulb.  Emptying the Jackson-Pratt bulb- To empty the bulb: 1. Release the plug on the top of the bulb. 2. Pour the bulb's contents into a measuring container which your nurse will provide. 3. Record the time emptied and amount of drainage. Empty the drain(s) as often as your     doctor or nurse recommends.  Date                  Time                    Amount (Drain 1)                  _____________________________________________________________________  _____________________________________________________________________  _____________________________________________________________________  _____________________________________________________________________  _____________________________________________________________________  _____________________________________________________________________  _____________________________________________________________________  _____________________________________________________________________  Squeezing the Jackson-Pratt Bulb- To squeeze the bulb: 1. Make sure the plug at the top of the bulb is open. 2. Squeeze the bulb tightly in your fist. You will hear air squeezing from the bulb. 3. Replace the plug while the bulb is squeezed. 4. Use a safety pin to attach the bulb to  your clothing. This will keep the catheter from     pulling at the bulb insertion site.  When to call your doctor- Call your doctor if: Drain site becomes red, swollen or hot. You have a fever greater than 101 degrees F. There is oozing at the drain site. Drain falls out (apply a guaze bandage over the drain hole and secure it with tape). Drainage increases daily not related to activity patterns. (You will usually have more drainage when you are active than when you are resting.) Drainage has a bad odor.

## 2022-12-12 LAB — SURGICAL PATHOLOGY

## 2022-12-16 ENCOUNTER — Encounter: Payer: Self-pay | Admitting: *Deleted

## 2022-12-17 ENCOUNTER — Encounter: Payer: Self-pay | Admitting: Hematology and Oncology

## 2022-12-17 ENCOUNTER — Inpatient Hospital Stay: Payer: Medicare Other | Attending: Hematology and Oncology | Admitting: Hematology and Oncology

## 2022-12-17 ENCOUNTER — Other Ambulatory Visit: Payer: Self-pay

## 2022-12-17 VITALS — BP 167/59 | HR 73 | Temp 98.1°F | Resp 18 | Ht 68.0 in | Wt 169.9 lb

## 2022-12-17 DIAGNOSIS — Z170 Estrogen receptor positive status [ER+]: Secondary | ICD-10-CM | POA: Diagnosis not present

## 2022-12-17 DIAGNOSIS — Z171 Estrogen receptor negative status [ER-]: Secondary | ICD-10-CM | POA: Diagnosis not present

## 2022-12-17 DIAGNOSIS — Z9012 Acquired absence of left breast and nipple: Secondary | ICD-10-CM | POA: Insufficient documentation

## 2022-12-17 DIAGNOSIS — C50112 Malignant neoplasm of central portion of left female breast: Secondary | ICD-10-CM | POA: Insufficient documentation

## 2022-12-17 DIAGNOSIS — I10 Essential (primary) hypertension: Secondary | ICD-10-CM | POA: Insufficient documentation

## 2022-12-17 DIAGNOSIS — Z79899 Other long term (current) drug therapy: Secondary | ICD-10-CM | POA: Diagnosis not present

## 2022-12-17 NOTE — Assessment & Plan Note (Addendum)
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  She could not tolerate adjuvant endocrine therapy.  She most recently had a mammogram which showed a left breast mass.  She is to this postlumpectomy which showed apocrine invasive adenocarcinoma, grade 2, triple negative.  Proliferation index is at 5%.  She is hoping to proceed with mastectomy.  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    She is here after mastectomy. This showed no evidence of residual carcinoma. We discussed about considering TC every 3 weeks for 4 cycles given the lumpectomy pathology results. TC comprises of chemotherapy agents including docetaxel and cyclophosphamide and adverse effects include but not limited to fatigue, nausea, vomiting, diarrhea, increased risk of infections, neuropathy, alopecia etc. She previously received TC. She tells me that she doesn't want to take any chemotherapy. She understands the risks and benefits.  She would like to continue with surveillance. She will RTC with me in 6 months.  Benay Pike MD

## 2022-12-17 NOTE — Progress Notes (Signed)
[facility] NOTE  Patient Care Team: [place] Nation, MD as PCP - General (Internal Medicine) Mauro Kaufmann, RN as Oncology Nurse Navigator Rockwell Germany, RN as Oncology Nurse Navigator Benay Pike, MD as Consulting Physician (Hematology and Oncology)  CHIEF COMPLAINTS/PURPOSE OF CONSULTATION:  Newly diagnosed breast cancer  HISTORY OF PRESENTING ILLNESS:  Brittany Carr 77 y.o. female is here because of recent diagnosis of left breast cancer.  I reviewed her records extensively and collaborated the history with the patient.  SUMMARY OF ONCOLOGIC HISTORY: Oncology History  Cancer of central portion of left female breast ([place])  08/16/2018 Initial Diagnosis   Cancer of central portion of left female breast ([place])   11/25/2022 Cancer Staging   Staging form: Breast, AJCC 8th Edition - Clinical: G2, ER-, PR-, HER2- - Signed by Benay Pike, MD on 11/25/2022 Nuclear grade: GX Histologic grading system: 3 grade system   11/25/2022 Cancer Staging   Staging form: Breast, AJCC 8th Edition - Pathologic: Stage IB (pT1, pN0, cM0, G2, ER-, PR-, HER2-) - Signed by Benay Pike, MD on 11/25/2022 Stage prefix: Initial diagnosis Histologic grading system: 3 grade system    This is a very pleasant 77 yr old post menopausal female patient with history of right breast IDC, 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 She couldn't tolerate adjuvant endocrine therapy. She had unilateral mammogram, possible mass in the left breast. Left breast diagnostic mammogram new irregular mass the upper outer left breast.  Targeted ultrasound is performed, showing a mass in the left breast at 2 o'clock, 7 cm from the nipple measuring 4 x 4 mm. No axillary adenopathy.  Pathology showed Apocrine  adenocarcinoma (invasive),  grade 2, 8 mm in greatest dimension, all margins negative for invasive carcinoma.  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  Prognostics showed triple negative biology, Ki 67 of 5%  She is here for a visit after mastectomy. This didn't show any residual carcinoma.  MEDICAL HISTORY:  Past Medical History:  Diagnosis Date   Abnormal mammogram of left breast 08/16/2018   Arthritis    "knees, hands" (08/16/2018)   Cancer of central portion of right female breast ([place]) 08/16/2018   Chronic headaches    "stress; weekly" (08/16/2018)   GERD (gastroesophageal reflux disease)    History of kidney stones    Hypertension    Migraine    "a couple/year" (08/16/2018)    SURGICAL HISTORY: Past Surgical History:  Procedure Laterality Date   BREAST BIOPSY Bilateral 07/21/2018   BREAST BIOPSY Left 07/23/2018   BREAST BIOPSY Left 10/13/2022   [place] LT RADIOACTIVE SEED LOC 10/13/2022 [facility]   BREAST LUMPECTOMY Left 08/16/2018   LEFT BREAST LUMPECTOMY WITH RADIOACTIVE SEED LOCALIZATION   BREAST LUMPECTOMY WITH RADIOACTIVE SEED LOCALIZATION Left 08/16/2018   Procedure: LEFT BREAST LUMPECTOMY WITH RADIOACTIVE SEED LOCALIZATION;  Surgeon: Fanny Skates, MD;  Location: [facility];  Service: General;  Laterality: Left;   BREAST LUMPECTOMY WITH RADIOACTIVE SEED LOCALIZATION Left 10/14/2022   Procedure: LEFT BREAST LUMPECTOMY WITH RADIOACTIVE SEED LOCALIZATION;  Surgeon: Coralie Keens, MD;  Location: [facility];  Service: General;  Laterality: Left;   CATARACT EXTRACTION W/ INTRAOCULAR LENS  IMPLANT, BILATERAL Bilateral    MASS EXCISION Left 10/14/2022  Procedure: EXCISION LEFT BACK MASS;  Surgeon: Coralie Keens, MD;  Location: [facility];  Service: General;  Laterality: Left;   MASTECTOMY Right 08/16/2018   WITH TARGETED AXILLARY LYMPH NODE DISSECTION   MASTECTOMY W/  SENTINEL NODE BIOPSY Left 12/10/2022   Procedure: LEFT MASTECTOMY WITH SENTINEL LYMPH NODE BIOPSY;  Surgeon: Coralie Keens, MD;  Location: [facility];  Service: General;  Laterality: Left;   MASTECTOMY WITH AXILLARY LYMPH NODE DISSECTION Right 08/16/2018   Procedure: RIGHT TOTAL MASTECTOMY WITH TARGETED AXILLARY LYMPH NODE DISSECTION;  Surgeon: Fanny Skates, MD;  Location: [facility];  Service: General;  Laterality: Right;   PORT-A-CATH REMOVAL N/A 05/16/2019   Procedure: REMOVAL PORT-A-CATH;  Surgeon: Fanny Skates, MD;  Location: [facility];  Service: General;  Laterality: N/A;   PORTACATH PLACEMENT Right 10/04/2018   Procedure: INSERTION PORT-A-CATH;  Surgeon: Fanny Skates, MD;  Location: [facility];  Service: General;  Laterality: Right;    SOCIAL HISTORY: Social History   Socioeconomic History   Marital status: Single    Spouse name: Not on file   Number of children: Not on file   Years of education: Not on file   Highest education level: Not on file  Occupational History   Not on file  Tobacco Use   Smoking status: Never   Smokeless tobacco: Never  Vaping Use   Vaping Use: Never used  Substance and Sexual Activity   Alcohol use: Never   Drug use: Never   Sexual activity: Not Currently  Other Topics Concern   Not on file  Social History Narrative   Has no one to drive her and stay with her DOS so she has to be [facility]   Social Determinants of Health   Financial Resource Strain: Low Risk  (12/04/2022)   Overall Financial Resource Strain (CARDIA)    Difficulty of Paying Living Expenses: Not very hard  Food Insecurity: No Food Insecurity (12/04/2022)   Hunger Vital Sign    Worried About Running Out of Food in the Last Year: Never true    Ran Out of Food in the Last Year: Never true  Transportation Needs: No Transportation Needs (12/04/2022)   PRAPARE - Hydrologist (Medical): No    Lack of  Transportation (Non-Medical): No  Physical Activity: Not on file  Stress: Not on file  Social Connections: Not on file  Intimate Partner Violence: Unknown (05/06/2019)   Humiliation, Afraid, Rape, and Kick questionnaire    Fear of Current or Ex-Partner: Not on file    Emotionally Abused: Not on file    Physically Abused: Not on file    Sexually Abused: Not asked    FAMILY HISTORY: No family history on file.  ALLERGIES:  is allergic to sulfa antibiotics.  MEDICATIONS:  Current Outpatient Medications  Medication Sig Dispense Refill   Cholecalciferol (VITAMIN D-3) 25 MCG (1000 UT) CAPS Take by mouth.     cyanocobalamin (VITAMIN B12) 250 MCG tablet Take 250 mcg by mouth daily.     latanoprost (XALATAN) 0.005 % ophthalmic solution Place 1 drop into the right eye at bedtime.     levothyroxine (SYNTHROID) 25 MCG tablet Take 25 mcg by mouth daily before breakfast.     lisinopril (PRINIVIL,ZESTRIL) 20 MG tablet Take 20 mg by mouth daily.  0   Multiple Vitamin (MULTIVITAMIN WITH MINERALS) TABS tablet Take 2 tablets by mouth daily. One-A-Day Proactive 65+     pantoprazole (PROTONIX) 20 MG  tablet Take 20 mg by mouth daily.  3   UNABLE TO FIND daily as needed. Med Name: Remus Loffler eye drops     UNABLE TO FIND Med Name: fiber supplement     No current facility-administered medications for this visit.    REVIEW OF SYSTEMS:   Constitutional: Denies fevers, chills or abnormal night sweats Eyes: Denies blurriness of vision, double vision or watery eyes Ears, nose, mouth, throat, and face: Denies mucositis or sore throat Respiratory: Denies cough, dyspnea or wheezes Cardiovascular: Denies palpitation, chest discomfort or lower extremity swelling Gastrointestinal:  Denies nausea, heartburn or change in bowel habits Skin: Denies abnormal skin rashes Lymphatics: Denies new lymphadenopathy or easy bruising Neurological:Denies numbness, tingling or new weaknesses Behavioral/Psych: Mood is stable, no  new changes  Breast: Denies any palpable lumps or discharge All other systems were reviewed with the patient and are negative.  PHYSICAL EXAMINATION: ECOG PERFORMANCE STATUS: 0 - Asymptomatic  Vitals:   12/17/22 1119  BP: (!) 167/59  Pulse: 73  Resp: 18  Temp: 98.1 F (36.7 C)  SpO2: 100%   Filed Weights   12/17/22 1119  Weight: 169 lb 14.4 oz (77.1 kg)    GENERAL:alert, no distress and comfortable She is status post right mastectomy.  She is now s/p left mastectomy, site is healing well.  LABORATORY DATA:  I have reviewed the data as listed Lab Results  Component Value Date   WBC 8.4 09/29/2018   HGB 13.4 09/29/2018   HCT 40.3 09/29/2018   MCV 92.4 09/29/2018   PLT 320 09/29/2018   Lab Results  Component Value Date   NA 137 09/29/2018   K 4.0 09/29/2018   CL 104 09/29/2018   CO2 26 09/29/2018    RADIOGRAPHIC STUDIES: I have personally reviewed the radiological reports and agreed with the findings in the report.  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  She could not tolerate adjuvant endocrine therapy.  She most recently had a mammogram which showed a left breast mass.  She is to this postlumpectomy which showed apocrine invasive adenocarcinoma, grade 2, triple negative.  Proliferation index is at 5%.  She is hoping to proceed with mastectomy.  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    She is here after mastectomy. This showed no evidence of residual carcinoma. We discussed about considering TC every 3 weeks for 4 cycles. TC comprises of chemotherapy agents  including docetaxel and cyclophosphamide and adverse effects include but not limited to fatigue, nausea, vomiting, diarrhea, increased risk of infections, neuropathy, alopecia etc.   Total time spent: 30 minutes including history, physical exam, review of records, counseling and coordination of care All questions were answered. The patient knows to call the clinic with any problems, questions or concerns.    Benay Pike, MD 12/17/22

## 2022-12-19 ENCOUNTER — Encounter: Payer: Self-pay | Admitting: *Deleted

## 2022-12-19 DIAGNOSIS — Z170 Estrogen receptor positive status [ER+]: Secondary | ICD-10-CM

## 2023-01-06 ENCOUNTER — Telehealth: Payer: Self-pay | Admitting: Hematology and Oncology

## 2023-01-06 NOTE — Telephone Encounter (Signed)
Per 2/27 IB reached out to schedule survivorship visit with Mendel Ryder, patient declined at this time. She's scheduled with [facility] in August.

## 2023-01-14 ENCOUNTER — Encounter: Payer: Self-pay | Admitting: "Endocrinology

## 2023-01-29 ENCOUNTER — Ambulatory Visit: Payer: Medicare Other | Admitting: "Endocrinology

## 2023-02-12 ENCOUNTER — Telehealth: Payer: Self-pay | Admitting: *Deleted

## 2023-02-12 NOTE — Telephone Encounter (Signed)
This RN spoke with pt per her call stating concern of results of recent left mastectomy and " how it looks and feels"  She states she had R mastectomy previously under Dr Dalbert Batman (now retired) "and it has no extra skin- just a scar that goes straight across"  She states the L mastectomy "has extra skin and it is all bunched up and when I go to move my arm it just doesn't feel right and it kind of feels like I still have a breast " " When I went to have the surgery I asked if the L would look like the R and I was told it would"  " I think I would like to have this surgery redone"  She states she has contacted Dr Pryor Montes office and have been given an appt for late May (29th).she does state that her niece in [employer] service is coming in May from the 23- 26 and "we have planned on going to the [facility]"  Pt states she would like to discuss her concerns with Dr Chryl Heck.  This RN validated her concerns and informed her above would be reviewed with MD for appropriate follow up.

## 2023-02-16 ENCOUNTER — Encounter: Payer: Self-pay | Admitting: "Endocrinology

## 2023-03-25 ENCOUNTER — Encounter: Payer: Self-pay | Admitting: "Endocrinology

## 2023-03-25 ENCOUNTER — Ambulatory Visit (INDEPENDENT_AMBULATORY_CARE_PROVIDER_SITE_OTHER): Payer: Medicare Other | Admitting: "Endocrinology

## 2023-03-25 VITALS — BP 126/78 | HR 80 | Ht 68.0 in | Wt 162.2 lb

## 2023-03-25 DIAGNOSIS — E042 Nontoxic multinodular goiter: Secondary | ICD-10-CM

## 2023-03-25 DIAGNOSIS — E039 Hypothyroidism, unspecified: Secondary | ICD-10-CM

## 2023-03-25 NOTE — Progress Notes (Unsigned)
Endocrinology Consult Note                                            03/25/2023, 5:22 PM   Subjective:    Patient ID: Brittany Carr, female    DOB: May 14, 1946, PCP Brittany Potts, MD   Past Medical History:  Diagnosis Date   Abnormal mammogram of left breast 08/16/2018   Arthritis    "knees, hands" (08/16/2018)   Cancer of central portion of right female breast (HCC) 08/16/2018   Chronic headaches    "stress; weekly" (08/16/2018)   GERD (gastroesophageal reflux disease)    History of kidney stones    Hypertension    Migraine    "a couple/year" (08/16/2018)   Past Surgical History:  Procedure Laterality Date   BREAST BIOPSY Bilateral 07/21/2018   BREAST BIOPSY Left 07/23/2018   BREAST BIOPSY Left 10/13/2022   [place] LT RADIOACTIVE SEED LOC 10/13/2022 [facility]   BREAST LUMPECTOMY Left 08/16/2018   LEFT BREAST LUMPECTOMY WITH RADIOACTIVE SEED LOCALIZATION   BREAST LUMPECTOMY WITH RADIOACTIVE SEED LOCALIZATION Left 08/16/2018   Procedure: LEFT BREAST LUMPECTOMY WITH RADIOACTIVE SEED LOCALIZATION;  Surgeon: Claud Kelp, MD;  [facility];  Service: General;  Laterality: Left;   BREAST LUMPECTOMY WITH RADIOACTIVE SEED LOCALIZATION Left 10/14/2022   Procedure: LEFT BREAST LUMPECTOMY WITH RADIOACTIVE SEED LOCALIZATION;  Surgeon: Abigail Miyamoto, MD;  Location: [facility];  Service: General;  Laterality: Left;   CATARACT EXTRACTION W/ INTRAOCULAR LENS  IMPLANT, BILATERAL Bilateral    MASS EXCISION Left 10/14/2022   Procedure: EXCISION LEFT BACK MASS;  Surgeon: Abigail Miyamoto, MD;  Location: [facility];  Service: General;  Laterality: Left;   MASTECTOMY Right 08/16/2018   WITH TARGETED AXILLARY LYMPH NODE DISSECTION   MASTECTOMY W/ SENTINEL NODE BIOPSY Left 12/10/2022   Procedure: LEFT MASTECTOMY WITH SENTINEL LYMPH NODE BIOPSY;  Surgeon: Abigail Miyamoto, MD;  Location: [facility];  Service: General;  Laterality:  Left;   MASTECTOMY WITH AXILLARY LYMPH NODE DISSECTION Right 08/16/2018   Procedure: RIGHT TOTAL MASTECTOMY WITH TARGETED AXILLARY LYMPH NODE DISSECTION;  Surgeon: Claud Kelp, MD;  [facility];  Service: General;  Laterality: Right;   PORT-A-CATH REMOVAL N/A 05/16/2019   Procedure: REMOVAL PORT-A-CATH;  Surgeon: Claud Kelp, MD;  Location: [facility];  Service: General;  Laterality: N/A;   PORTACATH PLACEMENT Right 10/04/2018   Procedure: INSERTION PORT-A-CATH;  Surgeon: Claud Kelp, MD;  Location:  SUR[facility]ERY CENTER;  Service: General;  Laterality: Right;   Social History   Socioeconomic History   Marital status: Single    Spouse name: Not on file   Number of children: Not on file   Years of education: Not on file   Highest education level: Not on file  Occupational History   Not on file  Tobacco Use   Smoking status: Never   Smokeless tobacco: Never  Vaping Use   Vaping Use: Never used  Substance and Sexual Activity   Alcohol use: Never   Drug use: Never   Sexual activity: Not Currently  Other Topics Concern   Not on file  Social History Narrative   Has no one to drive her and stay with her DOS so she has to be [facility]   Social Determinants of Health   Financial Resource Strain: Low Risk  (  12/04/2022)   Overall Financial Resource Strain (CARDIA)    Difficulty of Paying Living Expenses: Not very hard  Food Insecurity: No Food Insecurity (12/04/2022)   Hunger Vital Sign    Worried About Running Out of Food in the Last Year: Never true    Ran Out of Food in the Last Year: Never true  Transportation Needs: No Transportation Needs (12/04/2022)   PRAPARE - Administrator, Civil Service (Medical): No    Lack of Transportation (Non-Medical): No  Physical Activity: Not on file  Stress: Not on file  Social Connections: Not on file   Family History  Problem Relation Age of Onset   Hypertension Mother    Diabetes Mother     Osteoporosis Mother    Hypertension Father    Diabetes Father    Stroke Father    Outpatient Encounter Medications as of 03/25/2023  Medication Sig   famotidine (PEPCID) 40 MG tablet Take 40 mg by mouth daily.   Potassium Bicarbonate 99 MG CAPS Take 2 capsules by mouth daily.   psyllium (METAMUCIL) 58.6 % packet Take 1 packet by mouth daily.   Cholecalciferol (VITAMIN D-3) 25 MCG (1000 UT) CAPS Take by mouth.   cyanocobalamin (VITAMIN B12) 250 MCG tablet Take 250 mcg by mouth daily.   latanoprost (XALATAN) 0.005 % ophthalmic solution Place 1 drop into the right eye at bedtime.   levothyroxine (SYNTHROID) 25 MCG tablet Take 25 mcg by mouth daily before breakfast.   lisinopril (PRINIVIL,ZESTRIL) 20 MG tablet Take 40 mg by mouth daily.   Multiple Vitamin (MULTIVITAMIN WITH MINERALS) TABS tablet Take 2 tablets by mouth daily. One-A-Day Proactive 65+   pantoprazole (PROTONIX) 20 MG tablet Take 20 mg by mouth daily.   UNABLE TO FIND daily as needed. Med Name: Joya Martyr eye drops   UNABLE TO FIND Med Name: fiber supplement   No facility-administered encounter medications on file as of 03/25/2023.   ALLERGIES: Allergies  Allergen Reactions   Sulfa Antibiotics Other (See Comments)    "make me feel weird" , did not work    VACCINATION STATUS: Immunization History  Administered Date(s) Administered   Influenza-Unspecified 08/06/2018   Moderna Sars-Covid-2 Vaccination 01/05/2020, 01/30/2020, 10/22/2020, 04/22/2021    HPI Brittany Carr is 77 y.o. female who presents today with a medical history as above. she is being seen in consultation for history of multinodular goiter requested by Brittany Potts, MD.  History is obtained directly from the patient as well as chart review.  Patient is known to have multinodular goiter from previous imaging studies.  She did undergo fine-needle aspiration of right lobe nodule in December 2022 with benign findings.  Her most recent thyroid ultrasound was in  October 2023 showing stable findings.  Her last TSH was in January 2024 when it was 3.4.  She is on low-dose levothyroxine 25 mcg p.o. daily before breakfast.  She is compliant and consistent on her medications.    She does not have new or recent complaints or thyroid concerns.  She denies family history of thyroid malignancy. She does not have recent thyroid function test. She denies dysphagia, shortness breath, nor voice change.  She denies palpitations, tremors, nor heat/cold intolerance.  She has other chronic problems including hypertension, history of breast cancer.  Review of Systems  Constitutional: + Mildly fluctuating body weight,  no fatigue, no subjective hyperthermia, no subjective hypothermia Eyes: no blurry vision, no xerophthalmia ENT: no sore throat, no nodules palpated in throat, no dysphagia/odynophagia, no  hoarseness Cardiovascular: no Chest Pain, no Shortness of Breath, no palpitations, no leg swelling Respiratory: no cough, no shortness of breath Gastrointestinal: no Nausea/Vomiting/Diarhhea Musculoskeletal: no muscle/joint aches Skin: no rashes Neurological: no tremors, no numbness, no tingling, no dizziness Psychiatric: no depression, no anxiety  Objective:       03/25/2023   12:57 PM 12/17/2022   11:19 AM 12/11/2022    6:00 AM  Vitals with BMI  Height 5\' 8"  5\' 8"    Weight 162 lbs 3 oz 169 lbs 14 oz   BMI 24.67 25.84   Systolic 126 167 161  Diastolic 78 59 69  Pulse 80 73 70    BP 126/78   Pulse 80   Ht 5\' 8"  (1.727 m)   Wt 162 lb 3.2 oz (73.6 kg)   BMI 24.66 kg/m   Wt Readings from Last 3 Encounters:  03/25/23 162 lb 3.2 oz (73.6 kg)  12/17/22 169 lb 14.4 oz (77.1 kg)  12/10/22 171 lb 8.3 oz (77.8 kg)    Physical Exam  Constitutional:  Body mass index is 24.66 kg/m.,  not in acute distress, normal state of mind Eyes: PERRLA, EOMI, no exophthalmos ENT: moist mucous membranes, no gross thyromegaly, no gross cervical  lymphadenopathy Cardiovascular: normal precordial activity, Regular Rate and Rhythm, no Murmur/Rubs/Gallops Respiratory:  adequate breathing efforts, no gross chest deformity, Clear to auscultation bilaterally Gastrointestinal: abdomen soft, Non -tender, No distension, Bowel Sounds present, no gross organomegaly Musculoskeletal: no gross deformities, strength intact in all four extremities, no peripheral edema Skin: moist, warm, no rashes Neurological: no tremor with outstretched hands, Deep tendon reflexes normal in bilateral lower extremities.  CMP ( most recent) CMP     Component Value Date/Time   NA 137 09/29/2018 1507   K 4.0 09/29/2018 1507   CL 104 09/29/2018 1507   CO2 26 09/29/2018 1507   GLUCOSE 95 09/29/2018 1507   BUN 19 09/29/2018 1507   CREATININE 0.78 09/29/2018 1507   CALCIUM 9.6 09/29/2018 1507   PROT 7.3 09/29/2018 1507   ALBUMIN 3.9 09/29/2018 1507   AST 23 09/29/2018 1507   ALT 22 09/29/2018 1507   ALKPHOS 78 09/29/2018 1507   BILITOT 0.6 09/29/2018 1507   GFRNONAA >60 09/29/2018 1507   GFRAA >60 09/29/2018 1507   Thyroid ultrasound on August 23, 2022 right lobe 3.9 cm, left lobe 4.7 cm.  She had left lobe nodule measuring 1.0 cm, benign features.  Right lobe nodule measuring 1.9 cm, previously biopsied, stable in size.   TSH was 3.4 on December 03, 2022.  Assessment & Plan:   1. Acquired hypothyroidism 2. Multiple thyroid nodules  - Brittany Carr  is being seen at a kind request of Brittany Potts, MD. - I have reviewed her available thyroid records and clinically evaluated the patient. - Based on these reviews, she has benign multinodular goiter, with recent imaging showing stable findings in October 2023.  She will not need intervention for this at this time.  She is currently on levothyroxine 25 mcg p.o. daily before breakfast for acquired mild hypothyroidism.  She is advised to continue the same dose until she gets a new set of thyroid function  tests before her next visit.   - We discussed about the correct intake of her thyroid hormone, on empty stomach at fasting, with water, separated by at least 30 minutes from breakfast and other medications,  and separated by more than 4 hours from calcium, iron, multivitamins, acid reflux medications (PPIs). -Patient is  made aware of the fact that thyroid hormone replacement is needed for life, dose to be adjusted by periodic monitoring of thyroid function tests.  - I did not initiate any new prescriptions today. - she is advised to maintain close follow up with Brittany Potts, MD for primary care needs.   - Time spent with the patient: 45 minutes, of which >50% was spent in  counseling her about her hypothyroidism, multinodular goiter and the rest in obtaining information about her symptoms, reviewing her previous labs/studies ( including abstractions from other facilities),  evaluations, and treatments,  and developing a plan to confirm diagnosis and long term treatment based on the latest standards of care/guidelines; and documenting her care.  P[employer] participated in the discussions, expressed understanding, and voiced agreement with the above plans.  All questions were answered to her satisfaction. she is encouraged to contact clinic should she have any questions or concerns prior to her return visit.  Follow up plan: Return in about 2 weeks (around 04/08/2023) for F/U with Pre-visit Labs.   Marquis Lunch, MD [facility] [address] [place], [place] [address] Phone: [phone]  Fax: [phone]     03/25/2023, 5:22 PM  This note was partially dictated with voice recognition software. Similar sounding words can be transcribed inadequately or may not  be corrected upon review.

## 2023-03-28 LAB — T4, FREE: Free T4: 1.38 ng/dL (ref 0.82–1.77)

## 2023-03-28 LAB — TSH: TSH: 3.11 u[IU]/mL (ref 0.450–4.500)

## 2023-03-28 LAB — THYROGLOBULIN ANTIBODY: Thyroglobulin Antibody: 3.4 IU/mL — ABNORMAL HIGH (ref 0.0–0.9)

## 2023-03-28 LAB — THYROID PEROXIDASE ANTIBODY: Thyroperoxidase Ab SerPl-aCnc: 63 IU/mL — ABNORMAL HIGH (ref 0–34)

## 2023-04-21 ENCOUNTER — Encounter: Payer: Self-pay | Admitting: "Endocrinology

## 2023-04-21 ENCOUNTER — Ambulatory Visit (INDEPENDENT_AMBULATORY_CARE_PROVIDER_SITE_OTHER): Payer: Medicare Other | Admitting: "Endocrinology

## 2023-04-21 VITALS — BP 136/76 | HR 64 | Ht 68.0 in | Wt 163.4 lb

## 2023-04-21 DIAGNOSIS — E042 Nontoxic multinodular goiter: Secondary | ICD-10-CM

## 2023-04-21 DIAGNOSIS — E063 Autoimmune thyroiditis: Secondary | ICD-10-CM | POA: Diagnosis not present

## 2023-04-21 DIAGNOSIS — E038 Other specified hypothyroidism: Secondary | ICD-10-CM

## 2023-04-21 NOTE — Progress Notes (Signed)
Endocrinology follow-up note                                             04/21/2023, 1:36 PM   Subjective:    Patient ID: Brittany Carr, female    DOB: Mar 12, 1946, PCP [name], MD   Past Medical History:  Diagnosis Date   Abnormal mammogram of left breast 08/16/2018   Arthritis    "knees, hands" (08/16/2018)   Cancer of central portion of right female breast (HCC) 08/16/2018   Chronic headaches    "stress; weekly" (08/16/2018)   GERD (gastroesophageal reflux disease)    History of kidney stones    Hypertension    Migraine    "a couple/year" (08/16/2018)   Past Surgical History:  Procedure Laterality Date   BREAST BIOPSY Bilateral 07/21/2018   BREAST BIOPSY Left 07/23/2018   BREAST BIOPSY Left 10/13/2022   [place] LT RADIOACTIVE SEED LOC 10/13/2022 [facility]   BREAST LUMPECTOMY Left 08/16/2018   LEFT BREAST LUMPECTOMY WITH RADIOACTIVE SEED LOCALIZATION   BREAST LUMPECTOMY WITH RADIOACTIVE SEED LOCALIZATION Left 08/16/2018   Procedure: LEFT BREAST LUMPECTOMY WITH RADIOACTIVE SEED LOCALIZATION;  Surgeon: Claud Kelp, MD;  [facility];  Service: General;  Laterality: Left;   BREAST LUMPECTOMY WITH RADIOACTIVE SEED LOCALIZATION Left 10/14/2022   Procedure: LEFT BREAST LUMPECTOMY WITH RADIOACTIVE SEED LOCALIZATION;  Surgeon: Abigail Miyamoto, MD;  Location: [facility];  Service: General;  Laterality: Left;   CATARACT EXTRACTION W/ INTRAOCULAR LENS  IMPLANT, BILATERAL Bilateral    MASS EXCISION Left 10/14/2022   Procedure: EXCISION LEFT BACK MASS;  Surgeon: Abigail Miyamoto, MD;  Location: [facility];  Service: General;  Laterality: Left;   MASTECTOMY Right 08/16/2018   WITH TARGETED AXILLARY LYMPH NODE DISSECTION   MASTECTOMY W/ SENTINEL NODE BIOPSY Left 12/10/2022   Procedure: LEFT MASTECTOMY WITH SENTINEL LYMPH NODE BIOPSY;  Surgeon: Abigail Miyamoto, MD;  Location: [facility];  Service: General;   Laterality: Left;   MASTECTOMY WITH AXILLARY LYMPH NODE DISSECTION Right 08/16/2018   Procedure: RIGHT TOTAL MASTECTOMY WITH TARGETED AXILLARY LYMPH NODE DISSECTION;  Surgeon: Claud Kelp, MD;  [facility];  Service: General;  Laterality: Right;   PORT-A-CATH REMOVAL N/A 05/16/2019   Procedure: REMOVAL PORT-A-CATH;  Surgeon: Claud Kelp, MD;  Location: [facility];  Service: General;  Laterality: N/A;   PORTACATH PLACEMENT Right 10/04/2018   Procedure: INSERTION PORT-A-CATH;  Surgeon: Claud Kelp, MD;  Location: [facility];  Service: General;  Laterality: Right;   Social History   Socioeconomic History   Marital status: Single    Spouse name: Not on file   Number of children: Not on file   Years of education: Not on file   Highest education level: Not on file  Occupational History   Not on file  Tobacco Use   Smoking status: Never   Smokeless tobacco: Never  Vaping Use   Vaping Use: Never used  Substance and Sexual Activity   Alcohol use: Never   Drug use: Never   Sexual activity: Not Currently  Other Topics Concern   Not on file  Social History Narrative   Has no one to drive her and stay with her DOS so she has to be [facility]   Social Determinants of Health   Financial Resource Strain: Low Risk  (  12/04/2022)   Overall Financial Resource Strain (CARDIA)    Difficulty of Paying Living Expenses: Not very hard  Food Insecurity: No Food Insecurity (12/04/2022)   Hunger Vital Sign    Worried About Running Out of Food in the Last Year: Never true    Ran Out of Food in the Last Year: Never true  Transportation Needs: No Transportation Needs (12/04/2022)   PRAPARE - Administrator, Civil Service (Medical): No    Lack of Transportation (Non-Medical): No  Physical Activity: Not on file  Stress: Not on file  Social Connections: Not on file   Family History  Problem Relation Age of Onset   Hypertension Mother    Diabetes  Mother    Osteoporosis Mother    Hypertension Father    Diabetes Father    Stroke Father    Outpatient Encounter Medications as of 04/21/2023  Medication Sig   Cholecalciferol (VITAMIN D-3) 25 MCG (1000 UT) CAPS Take by mouth.   cyanocobalamin (VITAMIN B12) 250 MCG tablet Take 250 mcg by mouth daily.   famotidine (PEPCID) 40 MG tablet Take 40 mg by mouth daily.   latanoprost (XALATAN) 0.005 % ophthalmic solution Place 1 drop into the right eye at bedtime.   levothyroxine (SYNTHROID) 25 MCG tablet Take 25 mcg by mouth daily before breakfast.   lisinopril (PRINIVIL,ZESTRIL) 20 MG tablet Take 40 mg by mouth daily.   Multiple Vitamin (MULTIVITAMIN WITH MINERALS) TABS tablet Take 2 tablets by mouth daily. One-A-Day Proactive 65+   pantoprazole (PROTONIX) 20 MG tablet Take 20 mg by mouth daily.   Potassium Bicarbonate 99 MG CAPS Take 2 capsules by mouth daily.   psyllium (METAMUCIL) 58.6 % packet Take 1 packet by mouth daily.   UNABLE TO FIND daily as needed. Med Name: Joya Martyr eye drops   [DISCONTINUED] UNABLE TO FIND Med Name: fiber supplement   No facility-administered encounter medications on file as of 04/21/2023.   ALLERGIES: Allergies  Allergen Reactions   Sulfa Antibiotics Other (See Comments)    "make me feel weird" , did not work    VACCINATION STATUS: Immunization History  Administered Date(s) Administered   Influenza-Unspecified 08/06/2018   Moderna Sars-Covid-2 Vaccination 01/05/2020, 01/30/2020, 10/22/2020, 04/22/2021    HPI Brittany Carr is 77 y.o. female who presents today with a medical history as above. she is being seen in follow-up after she was seen consultation for history of multinodular goiter requested by [name], MD.    Patient is known to have multinodular goiter from previous imaging studies.  She did undergo fine-needle aspiration of right lobe nodule in December 2022 with benign findings.  Her most recent thyroid ultrasound was in October 2023  showing stable findings.  She was initiated on low-dose levothyroxine for mild hypothyroidism.  She is currently on levothyroxine 25 mcg p.o. daily before breakfast.  Her previsit labs are consistent with appropriate replacement.     She does not have new or recent complaints or thyroid concerns.  She denies family history of thyroid malignancy. She does not have recent thyroid function test. She denies dysphagia, shortness breath, nor voice change.  She denies palpitations, tremors, nor heat/cold intolerance.  She has other chronic problems including hypertension, history of breast cancer.  Review of Systems  Constitutional: + Mildly fluctuating body weight,  no fatigue, no subjective hyperthermia, no subjective hypothermia Eyes: no blurry vision, no xerophthalmia ENT: no sore throat, no nodules palpated in throat, no dysphagia/odynophagia, no hoarseness  Objective:  04/21/2023    1:05 PM 03/25/2023   12:57 PM 12/17/2022   11:19 AM  Vitals with BMI  Height 5\' 8"  5\' 8"  5\' 8"   Weight 163 lbs 6 oz 162 lbs 3 oz 169 lbs 14 oz  BMI 24.85 24.67 25.84  Systolic 136 126 161  Diastolic 76 78 59  Pulse 64 80 73    BP 136/76   Pulse 64   Ht 5\' 8"  (1.727 m)   Wt 163 lb 6.4 oz (74.1 kg)   BMI 24.84 kg/m   Wt Readings from Last 3 Encounters:  04/21/23 163 lb 6.4 oz (74.1 kg)  03/25/23 162 lb 3.2 oz (73.6 kg)  12/17/22 169 lb 14.4 oz (77.1 kg)    Physical Exam  Constitutional:  Body mass index is 24.84 kg/m.,  not in acute distress, normal state of mind Eyes: PERRLA, EOMI, no exophthalmos ENT: moist mucous membranes, no gross thyromegaly, no gross cervical lymphadenopathy Cardiovascular: normal precordial activity, Regular Rate and Rhythm, no Murmur/Rubs/Gallops   CMP ( most recent) CMP     Component Value Date/Time   NA 137 09/29/2018 1507   K 4.0 09/29/2018 1507   CL 104 09/29/2018 1507   CO2 26 09/29/2018 1507   GLUCOSE 95 09/29/2018 1507   BUN 19 09/29/2018 1507    CREATININE 0.78 09/29/2018 1507   CALCIUM 9.6 09/29/2018 1507   PROT 7.3 09/29/2018 1507   ALBUMIN 3.9 09/29/2018 1507   AST 23 09/29/2018 1507   ALT 22 09/29/2018 1507   ALKPHOS 78 09/29/2018 1507   BILITOT 0.6 09/29/2018 1507   GFRNONAA >60 09/29/2018 1507   GFRAA >60 09/29/2018 1507   Thyroid ultrasound on August 23, 2022 right lobe 3.9 cm, left lobe 4.7 cm.  She had left lobe nodule measuring 1.0 cm, benign features.  Right lobe nodule measuring 1.9 cm, previously biopsied, stable in size.  Recent Results (from the past 2160 hour(s))  TSH     Status: None   Collection Time: 03/25/23  1:31 PM  Result Value Ref Range   TSH 3.110 0.450 - 4.500 uIU/mL  T4, free     Status: None   Collection Time: 03/25/23  1:31 PM  Result Value Ref Range   Free T4 1.38 0.82 - 1.77 ng/dL  Thyroid peroxidase antibody     Status: Abnormal   Collection Time: 03/25/23  1:31 PM  Result Value Ref Range   Thyroperoxidase Ab SerPl-aCnc 63 (H) 0 - 34 IU/mL  Thyroglobulin antibody     Status: Abnormal   Collection Time: 03/25/23  1:31 PM  Result Value Ref Range   Thyroglobulin Antibody 3.4 (H) 0.0 - 0.9 IU/mL    Comment: Thyroglobulin Antibody measured by Beckman Coulter Methodology It should be noted that the presence of thyroglobulin antibodies may not be pathogenic nor diagnostic, especially at very low levels. The assay manufacturer has found that four percent of individuals without evidence of thyroid disease or autoimmunity will have positive TgAb levels up to 4 IU/mL.        Assessment & Plan:   1. Acquired hypothyroidism 2. Multiple thyroid nodules  - Charity Devivo  is being seen at a kind request of [name], MD. - I have reviewed her new and available thyroid records and clinically evaluated the patient. - Based on these reviews, she has benign multinodular goiter, with recent imaging showing stable findings in October 2023.  She will not need intervention for this at this  time.   Her  previsit thyroid function tests are consistent with appropriate replacement.  He is advised to continue levothyroxine 25 mcg p.o. daily before breakfast.   - We discussed about the correct intake of her thyroid hormone, on empty stomach at fasting, with water, separated by at least 30 minutes from breakfast and other medications,  and separated by more than 4 hours from calcium, iron, multivitamins, acid reflux medications (PPIs). -Patient is made aware of the fact that thyroid hormone replacement is needed for life, dose to be adjusted by periodic monitoring of thyroid function tests.   - she is advised to maintain close follow up with [name], MD for primary care needs.   I spent  21  minutes in the care of the patient today including review of labs from Thyroid Function, CMP, and other relevant labs ; imaging/biopsy records (current and previous including abstractions from other facilities); face-to-face time discussing  her lab results and symptoms, medications doses, her options of short and long term treatment based on the latest standards of care / guidelines;   and documenting the encounter.  [employer]  participated in the discussions, expressed understanding, and voiced agreement with the above plans.  All questions were answered to her satisfaction. she is encouraged to contact clinic should she have any questions or concerns prior to her return visit.   Follow up plan: Return in about 6 months (around 10/21/2023) for Fasting Labs  in AM B4 8.   Marquis Lunch, MD [facility] [address] [place], [place] [address] Phone: [phone]  Fax: [phone]     04/21/2023, 1:36 PM  This note was partially dictated with voice recognition software. Similar sounding words can be transcribed inadequately or may not  be corrected upon review.

## 2023-06-02 ENCOUNTER — Telehealth: Payer: Self-pay | Admitting: Hematology and Oncology

## 2023-06-02 NOTE — Telephone Encounter (Signed)
Patient is aware of rescheduled appointment times/dates 

## 2023-06-17 ENCOUNTER — Ambulatory Visit: Payer: Medicare Other | Admitting: Hematology and Oncology

## 2023-06-29 ENCOUNTER — Encounter: Payer: Self-pay | Admitting: Internal Medicine

## 2023-07-02 ENCOUNTER — Ambulatory Visit: Payer: Medicare Other | Admitting: Internal Medicine

## 2023-07-29 ENCOUNTER — Inpatient Hospital Stay: Payer: Medicare Other | Attending: Hematology and Oncology | Admitting: Hematology and Oncology

## 2023-07-29 VITALS — BP 173/86 | HR 115 | Temp 98.1°F | Resp 18 | Ht 68.0 in | Wt 163.9 lb

## 2023-07-29 DIAGNOSIS — M1990 Unspecified osteoarthritis, unspecified site: Secondary | ICD-10-CM | POA: Insufficient documentation

## 2023-07-29 DIAGNOSIS — Z9013 Acquired absence of bilateral breasts and nipples: Secondary | ICD-10-CM | POA: Insufficient documentation

## 2023-07-29 DIAGNOSIS — C50112 Malignant neoplasm of central portion of left female breast: Secondary | ICD-10-CM

## 2023-07-29 DIAGNOSIS — Z170 Estrogen receptor positive status [ER+]: Secondary | ICD-10-CM

## 2023-07-29 DIAGNOSIS — Z853 Personal history of malignant neoplasm of breast: Secondary | ICD-10-CM | POA: Diagnosis present

## 2023-07-29 NOTE — Assessment & Plan Note (Signed)
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  She could not tolerate adjuvant endocrine therapy.  She most recently had a mammogram which showed a left breast mass.  She is to this postlumpectomy which showed apocrine invasive adenocarcinoma, grade 2, triple negative.  Proliferation index is at 5%. She then had left mastectomy. This showed no evidence of residual carcinoma. We discussed about considering TC every 3 weeks for 4 cycles given the lumpectomy pathology results. She declined chemo, now on observation alone.  Assessment and Plan    Breast Cancer Post bilateral mastectomy with dissatisfaction regarding the cosmetic outcome of the most recent surgery. No interest in reconstruction. No new symptoms suggestive of recurrence. -Continue observation. -Report any new symptoms promptly.  Arthritis Severe knee pain, not adequately controlled with Tylenol and ibuprofen.  -Consider another visit if pain remains uncontrolled.  General Health Maintenance -Plan to receive flu shot next week. -Follow-up in 6 months.    Rachel Moulds MD

## 2023-07-29 NOTE — Progress Notes (Signed)
[facility] CONSULT NOTE  Patient Care Team: [name], MD as PCP - General (Internal Medicine) Pershing Proud, RN as Oncology Nurse Navigator Donnelly Angelica, RN as Oncology Nurse Navigator Rachel Moulds, MD as Consulting Physician (Hematology and Oncology)  CHIEF COMPLAINTS/PURPOSE OF CONSULTATION:  Newly diagnosed breast cancer  HISTORY OF PRESENTING ILLNESS:  Brittany Carr 77 y.o. female is here because of recent diagnosis of left breast cancer.  I reviewed her records extensively and collaborated the history with the patient.  SUMMARY OF ONCOLOGIC HISTORY: Oncology History  Cancer of central portion of left female breast (HCC)  08/16/2018 Initial Diagnosis   Cancer of central portion of left female breast (HCC)   11/25/2022 Cancer Staging   Staging form: Breast, AJCC 8th Edition - Clinical: G2, ER-, PR-, HER2- - Signed by Rachel Moulds, MD on 11/25/2022 Nuclear grade: GX Histologic grading system: 3 grade system   11/25/2022 Cancer Staging   Staging form: Breast, AJCC 8th Edition - Pathologic: Stage IB (pT1, pN0, cM0, G2, ER-, PR-, HER2-) - Signed by Rachel Moulds, MD on 11/25/2022 Stage prefix: Initial diagnosis Histologic grading system: 3 grade system    This is a very pleasant 77 yr old post menopausal female patient with history of right breast IDC, 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 She couldn't tolerate adjuvant endocrine therapy. She had unilateral mammogram, possible mass in the left breast. Left breast diagnostic mammogram new irregular mass the upper outer left breast.  Targeted ultrasound is performed, showing a mass in the left breast at 2 o'clock, 7 cm from the nipple measuring 4 x 4 mm. No axillary adenopathy.  Pathology showed Apocrine  adenocarcinoma (invasive),  grade 2, 8 mm in greatest dimension, all margins negative for invasive carcinoma.  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  Prognostics showed triple negative biology, Ki 67 of 5% No residual carcinoma noted on mastectomy specimen. She declined adjuvant chemotherapy, now on observation alone.  Discussed the use of AI scribe software for clinical note transcription with the patient, who gave verbal consent to proceed.  History of Present Illness   The patient, a 77 year old with a history of bilateral breast cancer, presents for a routine follow-up after bilateral mastectomies. She reports dissatisfaction with the cosmetic outcome of the most recent surgery, describing it as 'a pocket waiting for something.' She also experiences intermittent 'shooting pains' in both breasts. Despite these issues, she has declined reconstruction due to age and insurance limitations.  In addition to the post-mastectomy concerns, the patient reports chronic knee pain, diagnosed as arthritis by an orthopedist. The pain is severe enough to disrupt sleep, but is somewhat managed with over-the-counter ibuprofen and Tylenol. She has not been offered other treatment options such as steroid injections or knee replacement.       MEDICAL HISTORY:  Past Medical History:  Diagnosis Date   Abnormal mammogram of left breast 08/16/2018   Arthritis    "knees, hands" (08/16/2018)   Cancer of central portion of right female breast (HCC) 08/16/2018   Chronic headaches    "stress; weekly" (08/16/2018)   GERD (gastroesophageal reflux disease)    History of kidney stones    Hypertension    Migraine    "a couple/year" (08/16/2018)  SURGICAL HISTORY: Past Surgical History:  Procedure Laterality Date   BREAST BIOPSY Bilateral 07/21/2018   BREAST BIOPSY Left 07/23/2018   BREAST BIOPSY Left 10/13/2022   [place] LT RADIOACTIVE SEED  LOC 10/13/2022 [facility]   BREAST LUMPECTOMY Left 08/16/2018   LEFT BREAST LUMPECTOMY WITH RADIOACTIVE SEED LOCALIZATION   BREAST LUMPECTOMY WITH RADIOACTIVE SEED LOCALIZATION Left 08/16/2018   Procedure: LEFT BREAST LUMPECTOMY WITH RADIOACTIVE SEED LOCALIZATION;  Surgeon: Claud Kelp, MD;  Location: [facility] OR;  Service: General;  Laterality: Left;   BREAST LUMPECTOMY WITH RADIOACTIVE SEED LOCALIZATION Left 10/14/2022   Procedure: LEFT BREAST LUMPECTOMY WITH RADIOACTIVE SEED LOCALIZATION;  Surgeon: Abigail Miyamoto, MD;  Location: [facility];  Service: General;  Laterality: Left;   CATARACT EXTRACTION W/ INTRAOCULAR LENS  IMPLANT, BILATERAL Bilateral    MASS EXCISION Left 10/14/2022   Procedure: EXCISION LEFT BACK MASS;  Surgeon: Abigail Miyamoto, MD;  Location: [facility];  Service: General;  Laterality: Left;   MASTECTOMY Right 08/16/2018   WITH TARGETED AXILLARY LYMPH NODE DISSECTION   MASTECTOMY W/ SENTINEL NODE BIOPSY Left 12/10/2022   Procedure: LEFT MASTECTOMY WITH SENTINEL LYMPH NODE BIOPSY;  Surgeon: Abigail Miyamoto, MD;  Location: [facility];  Service: General;  Laterality: Left;   MASTECTOMY WITH AXILLARY LYMPH NODE DISSECTION Right 08/16/2018   Procedure: RIGHT TOTAL MASTECTOMY WITH TARGETED AXILLARY LYMPH NODE DISSECTION;  Surgeon: Claud Kelp, MD;  Location: [facility] OR;  Service: General;  Laterality: Right;   PORT-A-CATH REMOVAL N/A 05/16/2019   Procedure: REMOVAL PORT-A-CATH;  Surgeon: Claud Kelp, MD;  Location: [facility];  Service: General;  Laterality: N/A;   PORTACATH PLACEMENT Right 10/04/2018   Procedure: INSERTION PORT-A-CATH;  Surgeon: Claud Kelp, MD;  Location: [facility];  Service: General;  Laterality: Right;    SOCIAL HISTORY: Social History   Socioeconomic History   Marital status: Single    Spouse name: Not on file   Number of children: Not on file   Years of education:  Not on file   Highest education level: Not on file  Occupational History   Not on file  Tobacco Use   Smoking status: Never   Smokeless tobacco: Never  Vaping Use   Vaping status: Never Used  Substance and Sexual Activity   Alcohol use: Never   Drug use: Never   Sexual activity: Not Currently  Other Topics Concern   Not on file  Social History Narrative   Has no one to drive her and stay with her DOS so she has to be [facility]   Social Determinants of Health   Financial Resource Strain: Low Risk  (12/04/2022)   Overall Financial Resource Strain (CARDIA)    Difficulty of Paying Living Expenses: Not very hard  Food Insecurity: No Food Insecurity (12/04/2022)   Hunger Vital Sign    Worried About Running Out of Food in the Last Year: Never true    Ran Out of Food in the Last Year: Never true  Transportation Needs: No Transportation Needs (12/04/2022)   PRAPARE - Administrator, Civil Service (Medical): No    Lack of Transportation (Non-Medical): No  Physical Activity: Sufficiently Active (08/24/2018)   Received [facility], [facility]   Exercise Vital Sign    Days of Exercise per Week: 7 days    Minutes of Exercise per Session: 30 min  Stress: No Stress Concern Present (04/22/2021)   Received from [facility], [facility]  [employer] of Occupational Health - Occupational Stress Questionnaire    Feeling of Stress : Not at all  Social Connections: Socially Isolated (08/24/2018)   Received from [facility], [facility]   Social Connection and Isolation Panel [NHANES]    Frequency of Communication with Friends and Family: More than three times a week    Frequency of Social Gatherings with Friends and Family: Three times a week    Attends Religious Services: Never    Active Member of Clubs or Organizations: No    Attends Banker Meetings: Never    Marital Status: Never married  Intimate Partner Violence: Unknown  (05/06/2019)   Humiliation, Afraid, Rape, and Kick questionnaire    Fear of Current or Ex-Partner: Not on file    Emotionally Abused: Not on file    Physically Abused: Not on file    Sexually Abused: Not asked    FAMILY HISTORY: Family History  Problem Relation Age of Onset   Hypertension Mother    Diabetes Mother    Osteoporosis Mother    Hypertension Father    Diabetes Father    Stroke Father     ALLERGIES:  is allergic to sulfa antibiotics.  MEDICATIONS:  Current Outpatient Medications  Medication Sig Dispense Refill   Cholecalciferol (VITAMIN D-3) 25 MCG (1000 UT) CAPS Take by mouth.     cyanocobalamin (VITAMIN B12) 250 MCG tablet Take 250 mcg by mouth daily.     famotidine (PEPCID) 40 MG tablet Take 40 mg by mouth daily.     latanoprost (XALATAN) 0.005 % ophthalmic solution Place 1 drop into the right eye at bedtime.     levothyroxine (SYNTHROID) 25 MCG tablet Take 25 mcg by mouth daily before breakfast.     lisinopril (PRINIVIL,ZESTRIL) 20 MG tablet Take 40 mg by mouth daily.  0   Multiple Vitamin (MULTIVITAMIN WITH MINERALS) TABS tablet Take 2 tablets by mouth daily. One-A-Day Proactive 65+     pantoprazole (PROTONIX) 20 MG tablet Take 20 mg by mouth daily.  3   Potassium Bicarbonate 99 MG CAPS Take 2 capsules by mouth daily.     psyllium (METAMUCIL) 58.6 % packet Take 1 packet by mouth daily.     UNABLE TO FIND daily as needed. Med Name: Joya Martyr eye drops     No current facility-administered medications for this visit.    REVIEW OF SYSTEMS:   Constitutional: Denies fevers, chills or abnormal night sweats Eyes: Denies blurriness of vision, double vision or watery eyes Ears, nose, mouth, throat, and face: Denies mucositis or sore throat Respiratory: Denies cough, dyspnea or wheezes Cardiovascular: Denies palpitation, chest discomfort or lower extremity swelling Gastrointestinal:  Denies nausea, heartburn or change in bowel habits Skin: Denies abnormal skin  rashes Lymphatics: Denies new lymphadenopathy or easy bruising Neurological:Denies numbness, tingling or new weaknesses Behavioral/Psych: Mood is stable, no new changes  Breast: Denies any palpable lumps or discharge All other systems were reviewed with the patient and are negative.  PHYSICAL EXAMINATION: ECOG PERFORMANCE STATUS: 0 - Asymptomatic  Vitals:   07/29/23 1429  BP: (!) 173/86  Pulse: (!) 115  Resp: 18  Temp: 98.1 F (36.7 C)  SpO2: 99%   Filed Weights   07/29/23 1429  Weight: 163 lb 14.4 oz (74.3 kg)    GENERAL:alert, no distress and comfortable Bilateral mastectomy. No concern for local recurrence or regional adenopathy No LE edema.  LABORATORY DATA:  I have reviewed the data as listed Lab Results  Component  Value Date   WBC 8.4 09/29/2018   HGB 13.4 09/29/2018   HCT 40.3 09/29/2018   MCV 92.4 09/29/2018   PLT 320 09/29/2018   Lab Results  Component Value Date   NA 137 09/29/2018   K 4.0 09/29/2018   CL 104 09/29/2018   CO2 26 09/29/2018    RADIOGRAPHIC STUDIES: I have personally reviewed the radiological reports and agreed with the findings in the report.  ASSESSMENT AND PLAN:  Cancer of central portion of left female breast [facility]) This is a very pleasant 77 year old female patient with past medical history significant for right breast high-grade invasive ductal carcinoma, ER/PR positive HER2 negative status postmastectomy followed by adjuvant chemotherapy.  She could not tolerate adjuvant endocrine therapy.  She most recently had a mammogram which showed a left breast mass.  She is to this postlumpectomy which showed apocrine invasive adenocarcinoma, grade 2, triple negative.  Proliferation index is at 5%. She then had left mastectomy. This showed no evidence of residual carcinoma. We discussed about considering TC every 3 weeks for 4 cycles given the lumpectomy pathology results. She declined chemo, now on observation alone.  Assessment and Plan     Breast Cancer Post bilateral mastectomy with dissatisfaction regarding the cosmetic outcome of the most recent surgery. No interest in reconstruction. No new symptoms suggestive of recurrence. -Continue observation. -Report any new symptoms promptly.  Arthritis Severe knee pain, not adequately controlled with Tylenol and ibuprofen.  -Consider another visit if pain remains uncontrolled.  General Health Maintenance -Plan to receive flu shot next week. -Follow-up in 6 months.    Rachel Moulds MD    Total time spent: 30 minutes including history, physical exam, review of records, counseling and coordination of care All questions were answered. The patient knows to call the clinic with any problems, questions or concerns.    Rachel Moulds, MD 07/29/23

## 2023-07-30 ENCOUNTER — Telehealth: Payer: Self-pay | Admitting: Internal Medicine

## 2023-07-30 ENCOUNTER — Ambulatory Visit: Payer: Medicare Other | Admitting: Internal Medicine

## 2023-07-30 NOTE — Telephone Encounter (Signed)
Patient presented to GI office today by referral from her PCP Dr. Mayford Knife for evaluation for abnormal LFTs.  Patient states that she is unsure why she is even in this office.  She does not wish to have GI evaluation today.  I explained the reasoning for the referral and evaluation today and she states she will have her liver tests rechecked and if still elevated she will call to remake appointment.  No charge for today's visit.

## 2023-08-10 ENCOUNTER — Telehealth: Payer: Self-pay | Admitting: "Endocrinology

## 2023-08-10 ENCOUNTER — Other Ambulatory Visit: Payer: Self-pay | Admitting: *Deleted

## 2023-08-10 DIAGNOSIS — E042 Nontoxic multinodular goiter: Secondary | ICD-10-CM

## 2023-08-10 DIAGNOSIS — E039 Hypothyroidism, unspecified: Secondary | ICD-10-CM

## 2023-08-10 DIAGNOSIS — E063 Autoimmune thyroiditis: Secondary | ICD-10-CM

## 2023-08-10 MED ORDER — LEVOTHYROXINE SODIUM 25 MCG PO TABS: 25.0000 ug | ORAL_TABLET | Freq: Every day | ORAL | 0 refills | Status: DC

## 2023-08-10 NOTE — Telephone Encounter (Addendum)
Pt asking for a refill on her  90 day supply levothyroxine (SYNTHROID) 25 MCG tablet.  [employer]

## 2023-08-10 NOTE — Telephone Encounter (Signed)
This has been done.

## 2023-10-14 LAB — T4, FREE: Free T4: 1.25 ng/dL (ref 0.82–1.77)

## 2023-10-14 LAB — LIPID PANEL
Chol/HDL Ratio: 3 {ratio} (ref 0.0–4.4)
Cholesterol, Total: 213 mg/dL — ABNORMAL HIGH (ref 100–199)
HDL: 70 mg/dL (ref >39)
LDL Chol Calc (NIH): 119 mg/dL — ABNORMAL HIGH (ref 0–99)
Triglycerides: 138 mg/dL (ref 0–149)
VLDL Cholesterol Cal: 24 mg/dL (ref 5–40)

## 2023-10-14 LAB — TSH: TSH: 3.45 u[IU]/mL (ref 0.450–4.500)

## 2023-10-21 ENCOUNTER — Encounter: Payer: Self-pay | Admitting: "Endocrinology

## 2023-10-21 ENCOUNTER — Ambulatory Visit (INDEPENDENT_AMBULATORY_CARE_PROVIDER_SITE_OTHER): Payer: Medicare Other | Admitting: "Endocrinology

## 2023-10-21 VITALS — BP 136/74 | HR 60 | Ht 68.0 in | Wt 163.4 lb

## 2023-10-21 DIAGNOSIS — E042 Nontoxic multinodular goiter: Secondary | ICD-10-CM | POA: Diagnosis not present

## 2023-10-21 DIAGNOSIS — E063 Autoimmune thyroiditis: Secondary | ICD-10-CM | POA: Insufficient documentation

## 2023-10-21 DIAGNOSIS — E782 Mixed hyperlipidemia: Secondary | ICD-10-CM | POA: Insufficient documentation

## 2023-10-21 MED ORDER — LEVOTHYROXINE SODIUM 25 MCG PO TABS: 25.0000 ug | ORAL_TABLET | Freq: Every day | ORAL | 1 refills | Status: DC

## 2023-10-21 NOTE — Progress Notes (Signed)
Endocrinology follow-up note                                             10/21/2023, 4:18 PM   Subjective:    Patient ID: Brittany Carr, female    DOB: 05-05-1946, PCP [name], MD   Past Medical History:  Diagnosis Date   Abnormal mammogram of left breast 08/16/2018   Arthritis    "knees, hands" (08/16/2018)   Cancer of central portion of right female breast (HCC) 08/16/2018   Chronic headaches    "stress; weekly" (08/16/2018)   GERD (gastroesophageal reflux disease)    History of kidney stones    Hypertension    Migraine    "a couple/year" (08/16/2018)   Past Surgical History:  Procedure Laterality Date   BREAST BIOPSY Bilateral 07/21/2018   BREAST BIOPSY Left 07/23/2018   BREAST BIOPSY Left 10/13/2022   [place] LT RADIOACTIVE SEED LOC 10/13/2022 [facility]   BREAST LUMPECTOMY Left 08/16/2018   LEFT BREAST LUMPECTOMY WITH RADIOACTIVE SEED LOCALIZATION   BREAST LUMPECTOMY WITH RADIOACTIVE SEED LOCALIZATION Left 08/16/2018   Procedure: LEFT BREAST LUMPECTOMY WITH RADIOACTIVE SEED LOCALIZATION;  Surgeon: Claud Kelp, MD;  Location: [facility];  Service: General;  Laterality: Left;   BREAST LUMPECTOMY WITH RADIOACTIVE SEED LOCALIZATION Left 10/14/2022   Procedure: LEFT BREAST LUMPECTOMY WITH RADIOACTIVE SEED LOCALIZATION;  Surgeon: Abigail Miyamoto, MD;  Location: [facility];  Service: General;  Laterality: Left;   CATARACT EXTRACTION W/ INTRAOCULAR LENS  IMPLANT, BILATERAL Bilateral    MASS EXCISION Left 10/14/2022   Procedure: EXCISION LEFT BACK MASS;  Surgeon: Abigail Miyamoto, MD;  Location: [facility];  Service: General;  Laterality: Left;   MASTECTOMY Right 08/16/2018   WITH TARGETED AXILLARY LYMPH NODE DISSECTION   MASTECTOMY W/ SENTINEL NODE BIOPSY Left 12/10/2022   Procedure: LEFT MASTECTOMY WITH SENTINEL LYMPH NODE BIOPSY;  Surgeon: Abigail Miyamoto, MD;  Location: [facility];  Service: General;   Laterality: Left;   MASTECTOMY WITH AXILLARY LYMPH NODE DISSECTION Right 08/16/2018   Procedure: RIGHT TOTAL MASTECTOMY WITH TARGETED AXILLARY LYMPH NODE DISSECTION;  Surgeon: Claud Kelp, MD;  Location: [facility];  Service: General;  Laterality: Right;   PORT-A-CATH REMOVAL N/A 05/16/2019   Procedure: REMOVAL PORT-A-CATH;  Surgeon: Claud Kelp, MD;  Location: [facility];  Service: General;  Laterality: N/A;   PORTACATH PLACEMENT Right 10/04/2018   Procedure: INSERTION PORT-A-CATH;  Surgeon: Claud Kelp, MD;  Location:  SURGERY CENTER;  Service: General;  Laterality: Right;   Social History   Socioeconomic History   Marital status: Single    Spouse name: Not on file   Number of children: Not on file   Years of education: Not on file   Highest education level: Not on file  Occupational History   Not on file  Tobacco Use   Smoking status: Never   Smokeless tobacco: Never  Vaping Use   Vaping status: Never Used  Substance and Sexual Activity   Alcohol use: Never   Drug use: Never   Sexual activity: Not Currently  Other Topics Concern   Not on file  Social History Narrative   Has no one to drive her and stay with her DOS so she has to be [facility]   Social Determinants of Health   Financial Resource Strain: Low Risk  (  12/04/2022)   Overall Financial Resource Strain (CARDIA)    Difficulty of Paying Living Expenses: Not very hard  Food Insecurity: No Food Insecurity (12/04/2022)   Hunger Vital Sign    Worried About Running Out of Food in the Last Year: Never true    Ran Out of Food in the Last Year: Never true  Transportation Needs: No Transportation Needs (12/04/2022)   PRAPARE - Administrator, Civil Service (Medical): No    Lack of Transportation (Non-Medical): No  Physical Activity: Sufficiently Active (08/24/2018)   Received from [facility], [facility]   Exercise Vital Sign    Days of Exercise per Week: 7 days     Minutes of Exercise per Session: 30 min  Stress: No Stress Concern Present (04/22/2021)   Received from [facility], [facility] of Occupational Health - Occupational Stress Questionnaire    Feeling of Stress : Not at all  Social Connections: Socially Isolated (08/24/2018)   Received from [facility], [facility]   Social Connection and Isolation Panel [NHANES]    Frequency of Communication with Friends and Family: More than three times a week    Frequency of Social Gatherings with Friends and Family: Three times a week    Attends Religious Services: Never    Active Member of Clubs or Organizations: No    Attends Banker Meetings: Never    Marital Status: Never married   Family History  Problem Relation Age of Onset   Hypertension Mother    Diabetes Mother    Osteoporosis Mother    Hypertension Father    Diabetes Father    Stroke Father    Outpatient Encounter Medications as of 10/21/2023  Medication Sig   Cholecalciferol (VITAMIN D-3) 25 MCG (1000 UT) CAPS Take by mouth.   cyanocobalamin (VITAMIN B12) 250 MCG tablet Take 250 mcg by mouth daily.   famotidine (PEPCID) 40 MG tablet Take 40 mg by mouth daily.   latanoprost (XALATAN) 0.005 % ophthalmic solution Place 1 drop into the right eye at bedtime.   levothyroxine (SYNTHROID) 25 MCG tablet Take 1 tablet (25 mcg total) by mouth daily before breakfast.   lisinopril (PRINIVIL,ZESTRIL) 20 MG tablet Take 40 mg by mouth daily.   Multiple Vitamin (MULTIVITAMIN WITH MINERALS) TABS tablet Take 2 tablets by mouth daily. One-A-Day Proactive 65+   pantoprazole (PROTONIX) 20 MG tablet Take 20 mg by mouth daily.   Potassium Bicarbonate 99 MG CAPS Take 2 capsules by mouth daily.   psyllium (METAMUCIL) 58.6 % packet Take 1 packet by mouth daily.   UNABLE TO FIND daily as needed. Med Name: Joya Martyr eye drops   [DISCONTINUED] levothyroxine (SYNTHROID) 25 MCG tablet Take 1 tablet (25 mcg total) by  mouth daily before breakfast.   No facility-administered encounter medications on file as of 10/21/2023.   ALLERGIES: Allergies  Allergen Reactions   Sulfa Antibiotics Other (See Comments)    "make me feel weird" , did not work    VACCINATION STATUS: Immunization History  Administered Date(s) Administered   Influenza-Unspecified 08/06/2018   Moderna Sars-Covid-2 Vaccination 01/05/2020, 01/30/2020, 10/22/2020, 04/22/2021    HPI Cheryal Goldfine is 77 y.o. female who presents today with a medical history as above. she is being seen in follow-up after she was seen consultation for history of multinodular goiter requested by [name], MD.    Patient is known to have multinodular goiter from previous imaging studies.  She did undergo fine-needle aspiration of right lobe nodule in December 2022 with benign findings.  Her most recent thyroid ultrasound was in October 2023 showing stable findings.  She is on low-dose levothyroxine 25 mcg for mild hypothyroidism.  She reports consistency and compliance.  Her previsit labs are consistent with appropriate replacement.     She does not have new or recent complaints or thyroid concerns.  She denies family history of thyroid malignancy. She does not have recent thyroid function test. She denies dysphagia, shortness breath, nor voice change.  She denies palpitations, tremors, nor heat/cold intolerance. Her labs are also showing hyperlipidemia.  She is not on treatment for hyperlipidemia.  She has other chronic problems including hypertension, history of breast cancer.  Review of Systems  Constitutional: + Mildly fluctuating body weight,  no fatigue, no subjective hyperthermia, no subjective hypothermia Eyes: no blurry vision, no xerophthalmia ENT: no sore throat, no nodules palpated in throat, no dysphagia/odynophagia, no hoarseness  Objective:       10/21/2023    1:34 PM 10/21/2023    1:19 PM 07/29/2023    2:29 PM  Vitals with  BMI  Height  5\' 8"  5\' 8"   Weight  163 lbs 6 oz 163 lbs 14 oz  BMI  24.85 24.93  Systolic 136 144 295  Diastolic 74 72 86  Pulse  60 115    BP 136/74 Comment: L arm with manual cuff  Pulse 60   Ht 5\' 8"  (1.727 m)   Wt 163 lb 6.4 oz (74.1 kg)   BMI 24.84 kg/m   Wt Readings from Last 3 Encounters:  10/21/23 163 lb 6.4 oz (74.1 kg)  07/29/23 163 lb 14.4 oz (74.3 kg)  04/21/23 163 lb 6.4 oz (74.1 kg)    Physical Exam  Constitutional:  Body mass index is 24.84 kg/m.,  not in acute distress, normal state of mind Eyes: PERRLA, EOMI, no exophthalmos ENT: moist mucous membranes, no gross thyromegaly, no gross cervical lymphadenopathy Cardiovascular: normal precordial activity, Regular Rate and Rhythm, no Murmur/Rubs/Gallops   CMP ( most recent) CMP     Component Value Date/Time   NA 137 09/29/2018 1507   K 4.0 09/29/2018 1507   CL 104 09/29/2018 1507   CO2 26 09/29/2018 1507   GLUCOSE 95 09/29/2018 1507   BUN 19 09/29/2018 1507   CREATININE 0.78 09/29/2018 1507   CALCIUM 9.6 09/29/2018 1507   PROT 7.3 09/29/2018 1507   ALBUMIN 3.9 09/29/2018 1507   AST 23 09/29/2018 1507   ALT 22 09/29/2018 1507   ALKPHOS 78 09/29/2018 1507   BILITOT 0.6 09/29/2018 1507   GFRNONAA >60 09/29/2018 1507   GFRAA >60 09/29/2018 1507   Thyroid ultrasound on August 23, 2022 right lobe 3.9 cm, left lobe 4.7 cm.  She had left lobe nodule measuring 1.0 cm, benign features.  Right lobe nodule measuring 1.9 cm, previously biopsied, stable in size.  Recent Results (from the past 2160 hour(s))  TSH     Status: None   Collection Time: 10/13/23 11:39 AM  Result Value Ref Range   TSH 3.450 0.450 - 4.500 uIU/mL  T4, free     Status: None   Collection Time: 10/13/23 11:39 AM  Result Value Ref Range   Free T4 1.25 0.82 - 1.77 ng/dL  Lipid panel     Status: Abnormal   Collection Time: 10/13/23 11:39 AM  Result Value Ref Range   Cholesterol, Total 213 (H) 100 - 199 mg/dL   Triglycerides  138 0 - 149  mg/dL   HDL 70 >40 mg/dL   VLDL Cholesterol Cal 24 5 - 40 mg/dL   LDL Chol Calc (NIH) 347 (H) 0 - 99 mg/dL   Chol/HDL Ratio 3.0 0.0 - 4.4 ratio    Comment:                                   T. Chol/HDL Ratio                                             Men  Women                               1/2 Avg.Risk  3.4    3.3                                   Avg.Risk  5.0    4.4                                2X Avg.Risk  9.6    7.1                                3X Avg.Risk 23.4   11.0        Assessment & Plan:   1. Acquired hypothyroidism 2. Multiple thyroid nodules 3.  Hyperlipidemia  - Marshana Baken  is being seen at a kind request of [name], MD. - I have reviewed her new and available thyroid records and clinically evaluated the patient. - Based on these reviews, she has benign multinodular goiter, with recent imaging showing stable findings in October 2023.  She will not need intervention for this at this time.   Her previsit thyroid function tests are consistent with appropriate replacement.  She is advised to continue levothyroxine 25 mcg p.o. daily before breakfast.    - We discussed about the correct intake of her thyroid hormone, on empty stomach at fasting, with water, separated by at least 30 minutes from breakfast and other medications,  and separated by more than 4 hours from calcium, iron, multivitamins, acid reflux medications (PPIs). -Patient is made aware of the fact that thyroid hormone replacement is needed for life, dose to be adjusted by periodic monitoring of thyroid function tests.  Regarding her hyperlipidemia, she wishes to delay initiation of statins.  Whole food plan based diet was discussed and recommended to her.   - she is advised to maintain close follow up with [name], MD for primary care needs.   I spent  21  minutes in the care of the patient today including review of labs from Thyroid Function, CMP, and other relevant labs ;  imaging/biopsy records (current and previous including abstractions from other facilities); face-to-face time discussing  her lab results and symptoms, medications doses, her options of short and long term treatment based on the latest standards of care / guidelines;   and documenting the encounter.  [employer]  participated in the discussions, expressed understanding, and voiced agreement with the  above plans.  All questions were answered to her satisfaction. she is encouraged to contact clinic should she have any questions or concerns prior to her return visit.   Follow up plan: Return in about 6 months (around 04/20/2024) for Fasting Labs  in AM B4 8.   Marquis Lunch, MD [facility] [address] [place], [place] [address] Phone: [phone]  Fax: [phone]     10/21/2023, 4:18 PM  This note was partially dictated with voice recognition software. Similar sounding words can be transcribed inadequately or may not  be corrected upon review.

## 2024-01-25 ENCOUNTER — Telehealth: Payer: Self-pay

## 2024-01-25 NOTE — Telephone Encounter (Signed)
 Spoke with patient and confirmed appointment on 01/26/24

## 2024-01-26 ENCOUNTER — Inpatient Hospital Stay: Payer: Medicare Other | Attending: Hematology and Oncology | Admitting: Hematology and Oncology

## 2024-01-26 VITALS — BP 140/82 | HR 74 | Temp 98.1°F | Resp 16 | Wt 165.1 lb

## 2024-01-26 DIAGNOSIS — Z170 Estrogen receptor positive status [ER+]: Secondary | ICD-10-CM | POA: Insufficient documentation

## 2024-01-26 DIAGNOSIS — C50412 Malignant neoplasm of upper-outer quadrant of left female breast: Secondary | ICD-10-CM | POA: Insufficient documentation

## 2024-01-26 DIAGNOSIS — Z9013 Acquired absence of bilateral breasts and nipples: Secondary | ICD-10-CM | POA: Insufficient documentation

## 2024-01-26 DIAGNOSIS — C50112 Malignant neoplasm of central portion of left female breast: Secondary | ICD-10-CM

## 2024-01-26 NOTE — Progress Notes (Signed)
 [facility] CONSULT NOTE  Patient Care Team: [name], MD as PCP - General (Internal Medicine) Pershing Proud, RN as Oncology Nurse Navigator Donnelly Angelica, RN as Oncology Nurse Navigator Rachel Moulds, MD as Consulting Physician (Hematology and Oncology)  CHIEF COMPLAINTS/PURPOSE OF CONSULTATION:  Newly diagnosed breast cancer  HISTORY OF PRESENTING ILLNESS:  Brittany Carr 78 y.o. female is here because of recent diagnosis of left breast cancer.  I reviewed her records extensively and collaborated the history with the patient.  SUMMARY OF ONCOLOGIC HISTORY: Oncology History  Cancer of central portion of left female breast (HCC)  08/16/2018 Initial Diagnosis   Cancer of central portion of left female breast (HCC)   11/25/2022 Cancer Staging   Staging form: Breast, AJCC 8th Edition - Clinical: G2, ER-, PR-, HER2- - Signed by Rachel Moulds, MD on 11/25/2022 Nuclear grade: GX Histologic grading system: 3 grade system   11/25/2022 Cancer Staging   Staging form: Breast, AJCC 8th Edition - Pathologic: Stage IB (pT1, pN0, cM0, G2, ER-, PR-, HER2-) - Signed by Rachel Moulds, MD on 11/25/2022 Stage prefix: Initial diagnosis Histologic grading system: 3 grade system    This is a very pleasant 78 yr old post menopausal female patient with history of right breast IDC, 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 She couldn't tolerate adjuvant endocrine therapy. She had unilateral mammogram, possible mass in the left breast. Left breast diagnostic mammogram new irregular mass the upper outer left breast.  Targeted ultrasound is performed, showing a mass in the left breast at 2 o'clock, 7 cm from the nipple measuring 4 x 4 mm. No axillary adenopathy.  Pathology showed Apocrine  adenocarcinoma (invasive),  grade 2, 8 mm in greatest dimension, all margins negative for invasive carcinoma.  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  Prognostics showed triple negative biology, Ki 67 of 5% No residual carcinoma noted on mastectomy specimen. She declined adjuvant chemotherapy, now on observation alone.  Discussed the use of AI scribe software for clinical note transcription with the patient, who gave verbal consent to proceed.  History of Present Illness    Brittany Carr is a 78 year old female with bilateral breast cancer who presents for follow-up.  She has a history of bilateral breast cancer, with a right breast invasive ductal carcinoma treated with mastectomy and adjuvant chemotherapy. She attempted a medication post-surgery but was unable to tolerate the side effects. She is dissatisfied with the surgical outcome on one side, describing it as feeling like 'a pocket' that sometimes gets tight.  She has a history of knee issues, including a stress fracture diagnosed via MRI. She wore a brace for the fracture but notes ongoing weakness in her knees, attributing it to her age.  She experiences occasional diplopia and has been evaluated at an eye clinic where tests, including blood work, were conducted and returned negative results.  Her current medications include daily psyllium for bowel regularity, which she finds effective. She no longer takes pantoprazole or potassium bicarbonate.  No significant changes in her health since her last visit. No new health issues or hospitalizations. No changes in breathing, bowel movements are regular, and no headaches or falls.   MEDICAL HISTORY:  Past Medical History:  Diagnosis Date  Abnormal mammogram of left breast 08/16/2018   Arthritis    "knees, hands" (08/16/2018)   Cancer of central portion of right female breast (HCC) 08/16/2018   Chronic  headaches    "stress; weekly" (08/16/2018)   GERD (gastroesophageal reflux disease)    History of kidney stones    Hypertension    Migraine    "a couple/year" (08/16/2018)    SURGICAL HISTORY: Past Surgical History:  Procedure Laterality Date   BREAST BIOPSY Bilateral 07/21/2018   BREAST BIOPSY Left 07/23/2018   BREAST BIOPSY Left 10/13/2022   [place] LT RADIOACTIVE SEED LOC 10/13/2022 [facility]   BREAST LUMPECTOMY Left 08/16/2018   LEFT BREAST LUMPECTOMY WITH RADIOACTIVE SEED LOCALIZATION   BREAST LUMPECTOMY WITH RADIOACTIVE SEED LOCALIZATION Left 08/16/2018   Procedure: LEFT BREAST LUMPECTOMY WITH RADIOACTIVE SEED LOCALIZATION;  Surgeon: Claud Kelp, MD;  Location: [facility] OR;  Service: General;  Laterality: Left;   BREAST LUMPECTOMY WITH RADIOACTIVE SEED LOCALIZATION Left 10/14/2022   Procedure: LEFT BREAST LUMPECTOMY WITH RADIOACTIVE SEED LOCALIZATION;  Surgeon: Abigail Miyamoto, MD;  Location: [facility];  Service: General;  Laterality: Left;   CATARACT EXTRACTION W/ INTRAOCULAR LENS  IMPLANT, BILATERAL Bilateral    MASS EXCISION Left 10/14/2022   Procedure: EXCISION LEFT BACK MASS;  Surgeon: Abigail Miyamoto, MD;  Location: [facility];  Service: General;  Laterality: Left;   MASTECTOMY Right 08/16/2018   WITH TARGETED AXILLARY LYMPH NODE DISSECTION   MASTECTOMY W/ SENTINEL NODE BIOPSY Left 12/10/2022   Procedure: LEFT MASTECTOMY WITH SENTINEL LYMPH NODE BIOPSY;  Surgeon: Abigail Miyamoto, MD;  Location: [facility];  Service: General;  Laterality: Left;   MASTECTOMY WITH AXILLARY LYMPH NODE DISSECTION Right 08/16/2018   Procedure: RIGHT TOTAL MASTECTOMY WITH TARGETED AXILLARY LYMPH NODE DISSECTION;  Surgeon: Claud Kelp, MD;  Location: [facility];  Service: General;  Laterality: Right;   PORT-A-CATH REMOVAL N/A 05/16/2019   Procedure: REMOVAL PORT-A-CATH;  Surgeon: Claud Kelp, MD;  Location: [facility];  Service: General;   Laterality: N/A;   PORTACATH PLACEMENT Right 10/04/2018   Procedure: INSERTION PORT-A-CATH;  Surgeon: Claud Kelp, MD;  Location: [facility];  Service: General;  Laterality: Right;    SOCIAL HISTORY: Social History   Socioeconomic History   Marital status: Single    Spouse name: Not on file   Number of children: Not on file   Years of education: Not on file   Highest education level: Not on file  Occupational History   Not on file  Tobacco Use   Smoking status: Never   Smokeless tobacco: Never  Vaping Use   Vaping status: Never Used  Substance and Sexual Activity   Alcohol use: Never   Drug use: Never   Sexual activity: Not Currently  Other Topics Concern   Not on file  Social History Narrative   Has no one to drive her and stay with her DOS so she has to be [facility]   Social Drivers of Health   Financial Resource Strain: Low Risk  (12/04/2022)   Overall Financial Resource Strain (CARDIA)    Difficulty of Paying Living Expenses: Not very hard  Food Insecurity: No Food Insecurity (12/04/2022)   Hunger Vital Sign    Worried About Running Out of Food in the Last Year: Never true    Ran Out of Food in the Last Year: Never true  Transportation Needs: No Transportation Needs (12/04/2022)   PRAPARE - Administrator, Civil Service (Medical):  No    Lack of Transportation (Non-Medical): No  Physical Activity: Sufficiently Active (08/24/2018)   Received [facility], [facility]   Exercise Vital Sign    Days of Exercise per Week: 7 days    Minutes of Exercise per Session: 30 min  Stress: No Stress Concern Present (04/22/2021)   Received from [facility], [facility] - Occupational Stress Questionnaire    Feeling of Stress : Not at all  Social Connections: Socially Isolated (08/24/2018)   Received from [facility], [facility]   Social Connection and Isolation Panel  [NHANES]    Frequency of Communication with Friends and Family: More than three times a week    Frequency of Social Gatherings with Friends and Family: Three times a week    Attends Religious Services: Never    Active Member of Clubs or Organizations: No    Attends Banker Meetings: Never    Marital Status: Never married  Intimate Partner Violence: Unknown (05/06/2019)   Humiliation, Afraid, Rape, and Kick questionnaire    Fear of Current or Ex-Partner: Not on file    Emotionally Abused: Not on file    Physically Abused: Not on file    Sexually Abused: Not asked    FAMILY HISTORY: Family History  Problem Relation Age of Onset   Hypertension Mother    Diabetes Mother    Osteoporosis Mother    Hypertension Father    Diabetes Father    Stroke Father     ALLERGIES:  is allergic to sulfa antibiotics.  MEDICATIONS:  Current Outpatient Medications  Medication Sig Dispense Refill   Cholecalciferol (VITAMIN D-3) 25 MCG (1000 UT) CAPS Take by mouth.     cyanocobalamin (VITAMIN B12) 250 MCG tablet Take 250 mcg by mouth daily.     famotidine (PEPCID) 40 MG tablet Take 40 mg by mouth daily.     latanoprost (XALATAN) 0.005 % ophthalmic solution Place 1 drop into the right eye at bedtime.     levothyroxine (SYNTHROID) 25 MCG tablet Take 1 tablet (25 mcg total) by mouth daily before breakfast. 90 tablet 1   lisinopril (PRINIVIL,ZESTRIL) 20 MG tablet Take 40 mg by mouth daily.  0   Multiple Vitamin (MULTIVITAMIN WITH MINERALS) TABS tablet Take 2 tablets by mouth daily. One-A-Day Proactive 65+     psyllium (METAMUCIL) 58.6 % packet Take 1 packet by mouth daily.     UNABLE TO FIND daily as needed. Med Name: Joya Martyr eye drops     No current facility-administered medications for this visit.    REVIEW OF SYSTEMS:   Constitutional: Denies fevers, chills or abnormal night sweats Eyes: Denies blurriness of vision, double vision or watery eyes Ears, nose, mouth, throat, and face:  Denies mucositis or sore throat Respiratory: Denies cough, dyspnea or wheezes Cardiovascular: Denies palpitation, chest discomfort or lower extremity swelling Gastrointestinal:  Denies nausea, heartburn or change in bowel habits Skin: Denies abnormal skin rashes Lymphatics: Denies new lymphadenopathy or easy bruising Neurological:Denies numbness, tingling or new weaknesses Behavioral/Psych: Mood is stable, no new changes  Breast: Denies any palpable lumps or discharge All other systems were reviewed with the patient and are negative.  PHYSICAL EXAMINATION: ECOG PERFORMANCE STATUS: 0 - Asymptomatic  Vitals:   01/26/24 1332  BP: (!) 140/82  Pulse: 74  Resp: 16  Temp: 98.1 F (36.7 C)  SpO2: 96%   Filed Weights   01/26/24 1332  Weight:  165 lb 1.6 oz (74.9 kg)    GENERAL:alert, no distress and comfortable Bilateral mastectomy. No concern for local recurrence or regional adenopathy Chest" CTA bilaterally Heart: RRR No LE edema.  LABORATORY DATA:  I have reviewed the data as listed Lab Results  Component Value Date   WBC 8.4 09/29/2018   HGB 13.4 09/29/2018   HCT 40.3 09/29/2018   MCV 92.4 09/29/2018   PLT 320 09/29/2018   Lab Results  Component Value Date   NA 137 09/29/2018   K 4.0 09/29/2018   CL 104 09/29/2018   CO2 26 09/29/2018    RADIOGRAPHIC STUDIES: I have personally reviewed the radiological reports and agreed with the findings in the report.  ASSESSMENT AND PLAN:  Cancer of central portion of left female breast [facility]) This is a very pleasant 78 year old female patient with past medical history significant for right breast high-grade invasive ductal carcinoma, ER/PR positive HER2 negative status postmastectomy followed by adjuvant chemotherapy.  She could not tolerate adjuvant endocrine therapy.  She then had a mammogram which showed a left breast mass.  She is to this postlumpectomy which showed apocrine invasive adenocarcinoma, grade 2, triple negative.   Proliferation index is at 5%. She then had left mastectomy. This showed no evidence of residual carcinoma. We discussed about considering TC every 3 weeks for 4 cycles given the lumpectomy pathology results. She declined chemo, now on observation alone.  Assessment and Plan  Bilateral breast cancer Bilateral breast cancer with right breast IDC treated with mastectomy and adjuvant chemotherapy. Hormonal therapy not tolerated. No evidence of recurrence. Discussed MRD blood test for residual cancer detection, not FDA approved,  - Provided MRD testing pamphlet and contact for mobile blood draw. - Instructed her to arrange mobile blood draw. - Schedule MRD testing every six months.  General follow-up Well-managed health, no significant changes. December blood work unremarkable. - Scheduled follow-up in one year.  Rachel Moulds MD    Total time spent: 30 minutes including history, physical exam, review of records, counseling and coordination of care All questions were answered. The patient knows to call the clinic with any problems, questions or concerns.    Rachel Moulds, MD 01/26/24

## 2024-01-26 NOTE — Assessment & Plan Note (Addendum)
 This is a very pleasant 78 year old female patient with past medical history significant for right breast high-grade invasive ductal carcinoma, ER/PR positive HER2 negative status postmastectomy followed by adjuvant chemotherapy.  She could not tolerate adjuvant endocrine therapy.  She then had a mammogram which showed a left breast mass.  She is to this postlumpectomy which showed apocrine invasive adenocarcinoma, grade 2, triple negative.  Proliferation index is at 5%. She then had left mastectomy. This showed no evidence of residual carcinoma. We discussed about considering TC every 3 weeks for 4 cycles given the lumpectomy pathology results. She declined chemo, now on observation alone.  Assessment and Plan  Bilateral breast cancer Bilateral breast cancer with right breast IDC treated with mastectomy and adjuvant chemotherapy. Hormonal therapy not tolerated. No evidence of recurrence. Discussed MRD blood test for residual cancer detection, not FDA approved,  - Provided MRD testing pamphlet and contact for mobile blood draw. - Instructed her to arrange mobile blood draw. - Schedule MRD testing every six months.  General follow-up Well-managed health, no significant changes. December blood work unremarkable. - Scheduled follow-up in one year.  Rachel Moulds MD

## 2024-01-26 NOTE — Progress Notes (Signed)
 [facility] CONSULT NOTE  Patient Care Team: [name], MD as PCP - General (Internal Medicine) Pershing Proud, RN as Oncology Nurse Navigator Donnelly Angelica, RN as Oncology Nurse Navigator Rachel Moulds, MD as Consulting Physician (Hematology and Oncology)  CHIEF COMPLAINTS/PURPOSE OF CONSULTATION:  Newly diagnosed breast cancer  HISTORY OF PRESENTING ILLNESS:  [name] 78 y.o. female is here because of recent diagnosis of left breast cancer.  I reviewed her records extensively and collaborated the history with the patient.  SUMMARY OF ONCOLOGIC HISTORY: Oncology History  Cancer of central portion of left female breast (HCC)  08/16/2018 Initial Diagnosis   Cancer of central portion of left female breast (HCC)   11/25/2022 Cancer Staging   Staging form: Breast, AJCC 8th Edition - Clinical: G2, ER-, PR-, HER2- - Signed by Rachel Moulds, MD on 11/25/2022 Nuclear grade: GX Histologic grading system: 3 grade system   11/25/2022 Cancer Staging   Staging form: Breast, AJCC 8th Edition - Pathologic: Stage IB (pT1, pN0, cM0, G2, ER-, PR-, HER2-) - Signed by Rachel Moulds, MD on 11/25/2022 Stage prefix: Initial diagnosis Histologic grading system: 3 grade system    This is a very pleasant 78 yr old post menopausal female patient with history of right breast IDC, 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 She couldn't tolerate adjuvant endocrine therapy. She had unilateral mammogram, possible mass in the left breast. Left breast diagnostic mammogram new irregular mass the upper outer left breast.  Targeted ultrasound is performed, showing a mass in the left breast at 2 o'clock, 7 cm from the nipple measuring 4 x 4 mm. No axillary adenopathy.  Pathology showed Apocrine  adenocarcinoma (invasive),  grade 2, 8 mm in greatest dimension, all margins negative for invasive carcinoma.  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  Prognostics showed triple negative biology, Ki 67 of 5% No residual carcinoma noted on mastectomy specimen. She declined adjuvant chemotherapy, now on observation alone.  Discussed the use of AI scribe software for clinical note transcription with the patient, who gave verbal consent to proceed.  History of Present Illness           MEDICAL HISTORY:  Past Medical History:  Diagnosis Date   Abnormal mammogram of left breast 08/16/2018   Arthritis    "knees, hands" (08/16/2018)   Cancer of central portion of right female breast (HCC) 08/16/2018   Chronic headaches    "stress; weekly" (08/16/2018)   GERD (gastroesophageal reflux disease)    History of kidney stones    Hypertension    Migraine    "a couple/year" (08/16/2018)    SURGICAL HISTORY: Past Surgical History:  Procedure Laterality Date   BREAST BIOPSY Bilateral 07/21/2018   BREAST BIOPSY Left 07/23/2018   BREAST BIOPSY Left 10/13/2022   [place] LT RADIOACTIVE SEED LOC 10/13/2022 [facility]   BREAST LUMPECTOMY Left 08/16/2018   LEFT BREAST LUMPECTOMY WITH RADIOACTIVE SEED LOCALIZATION   BREAST LUMPECTOMY WITH RADIOACTIVE SEED LOCALIZATION Left 08/16/2018   Procedure: LEFT BREAST LUMPECTOMY WITH RADIOACTIVE SEED LOCALIZATION;  Surgeon: Claud Kelp, MD;  Location: [facility] OR;  Service: General;  Laterality: Left;   BREAST LUMPECTOMY WITH RADIOACTIVE SEED LOCALIZATION Left 10/14/2022   Procedure: LEFT BREAST LUMPECTOMY WITH RADIOACTIVE SEED LOCALIZATION;  Surgeon:  Abigail Miyamoto, MD;  Location: [facility];  Service: General;  Laterality: Left;   CATARACT EXTRACTION W/ INTRAOCULAR LENS  IMPLANT, BILATERAL Bilateral    MASS EXCISION Left 10/14/2022   Procedure: EXCISION LEFT BACK MASS;   Surgeon: Abigail Miyamoto, MD;  Location: [facility];  Service: General;  Laterality: Left;   MASTECTOMY Right 08/16/2018   WITH TARGETED AXILLARY LYMPH NODE DISSECTION   MASTECTOMY W/ SENTINEL NODE BIOPSY Left 12/10/2022   Procedure: LEFT MASTECTOMY WITH SENTINEL LYMPH NODE BIOPSY;  Surgeon: Abigail Miyamoto, MD;  Location: [facility];  Service: General;  Laterality: Left;   MASTECTOMY WITH AXILLARY LYMPH NODE DISSECTION Right 08/16/2018   Procedure: RIGHT TOTAL MASTECTOMY WITH TARGETED AXILLARY LYMPH NODE DISSECTION;  Surgeon: Claud Kelp, MD;  [facility];  Service: General;  Laterality: Right;   PORT-A-CATH REMOVAL N/A 05/16/2019   Procedure: REMOVAL PORT-A-CATH;  Surgeon: Claud Kelp, MD;  Location: [facility];  Service: General;  Laterality: N/A;   PORTACATH PLACEMENT Right 10/04/2018   Procedure: INSERTION PORT-A-CATH;  Surgeon: Claud Kelp, MD;  Location: [facility];  Service: General;  Laterality: Right;    SOCIAL HISTORY: Social History   Socioeconomic History   Marital status: Single    Spouse name: Not on file   Number of children: Not on file   Years of education: Not on file   Highest education level: Not on file  Occupational History   Not on file  Tobacco Use   Smoking status: Never   Smokeless tobacco: Never  Vaping Use   Vaping status: Never Used  Substance and Sexual Activity   Alcohol use: Never   Drug use: Never   Sexual activity: Not Currently  Other Topics Concern   Not on file  Social History Narrative   Has no one to drive her and stay with her DOS so she has to be [facility]   Social Drivers of Health   Financial Resource Strain: Low Risk  (12/04/2022)   Overall Financial Resource Strain (CARDIA)    Difficulty of Paying Living Expenses: Not very hard  Food Insecurity: No Food Insecurity (12/04/2022)   Hunger Vital Sign    Worried About Running Out of Food in the Last  Year: Never true    Ran Out of Food in the Last Year: Never true  Transportation Needs: No Transportation Needs (12/04/2022)   PRAPARE - Administrator, Civil Service (Medical): No    Lack of Transportation (Non-Medical): No  Physical Activity: Sufficiently Active (08/24/2018)   Received from [facility], [facility]   Exercise Vital Sign    Days of Exercise per Week: 7 days    Minutes of Exercise per Session: 30 min  Stress: No Stress Concern Present (04/22/2021)   Received from [facility], [facility] of Occupational Health - Occupational Stress Questionnaire    Feeling of Stress : Not at all  Social Connections: Socially Isolated (08/24/2018)   Received from [facility], [facility]   Social Connection and Isolation Panel [NHANES]    Frequency of Communication with Friends and Family: More than three times a week    Frequency of Social Gatherings with Friends and Family: Three times a week    Attends Religious Services: Never    Active Member of Clubs or Organizations: No    Attends Banker Meetings: Never    Marital Status: Never married  Intimate Partner Violence: Unknown (05/06/2019)   Humiliation, Afraid, Rape, and Kick questionnaire    Fear of Current or Ex-Partner: Not on file    Emotionally Abused: Not on file    Physically Abused: Not on file    Sexually Abused: Not asked    FAMILY HISTORY: Family History  Problem Relation Age of Onset   Hypertension Mother    Diabetes Mother    Osteoporosis Mother    Hypertension Father    Diabetes Father    Stroke Father     ALLERGIES:  is allergic to sulfa antibiotics.  MEDICATIONS:  Current Outpatient Medications  Medication Sig Dispense Refill   Cholecalciferol (VITAMIN D-3) 25 MCG (1000 UT) CAPS Take by mouth.     cyanocobalamin (VITAMIN B12) 250 MCG tablet Take 250 mcg by mouth daily.     famotidine (PEPCID) 40 MG tablet Take 40 mg by mouth daily.      latanoprost (XALATAN) 0.005 % ophthalmic solution Place 1 drop into the right eye at bedtime.     levothyroxine (SYNTHROID) 25 MCG tablet Take 1 tablet (25 mcg total) by mouth daily before breakfast. 90 tablet 1   lisinopril (PRINIVIL,ZESTRIL) 20 MG tablet Take 40 mg by mouth daily.  0   Multiple Vitamin (MULTIVITAMIN WITH MINERALS) TABS tablet Take 2 tablets by mouth daily. One-A-Day Proactive 65+     pantoprazole (PROTONIX) 20 MG tablet Take 20 mg by mouth daily.  3   Potassium Bicarbonate 99 MG CAPS Take 2 capsules by mouth daily.     psyllium (METAMUCIL) 58.6 % packet Take 1 packet by mouth daily.     UNABLE TO FIND daily as needed. Med Name: Joya Martyr eye drops     No current facility-administered medications for this visit.    REVIEW OF SYSTEMS:   Constitutional: Denies fevers, chills or abnormal night sweats Eyes: Denies blurriness of vision, double vision or watery eyes Ears, nose, mouth, throat, and face: Denies mucositis or sore throat Respiratory: Denies cough, dyspnea or wheezes Cardiovascular: Denies palpitation, chest discomfort or lower extremity swelling Gastrointestinal:  Denies nausea, heartburn or change in bowel habits Skin: Denies abnormal skin rashes Lymphatics: Denies new lymphadenopathy or easy bruising Neurological:Denies numbness, tingling or new weaknesses Behavioral/Psych: Mood is stable, no new changes  Breast: Denies any palpable lumps or discharge All other systems were reviewed with the patient and are negative.  PHYSICAL EXAMINATION: ECOG PERFORMANCE STATUS: 0 - Asymptomatic  Vitals:   01/26/24 1332  BP: (!) 140/82  Pulse: 74  Resp: 16  Temp: 98.1 F (36.7 C)  SpO2: 96%   Filed Weights   01/26/24 1332  Weight: 165 lb 1.6 oz (74.9 kg)    GENERAL:alert, no distress and comfortable Bilateral mastectomy. No concern for local recurrence or regional adenopathy No LE edema.  LABORATORY DATA:  I have reviewed the data as listed Lab Results   Component Value Date   WBC 8.4 09/29/2018   HGB 13.4 09/29/2018   HCT 40.3 09/29/2018   MCV 92.4 09/29/2018   PLT 320 09/29/2018   Lab Results  Component Value Date   NA 137 09/29/2018   K 4.0 09/29/2018   CL 104 09/29/2018   CO2 26 09/29/2018    RADIOGRAPHIC STUDIES: I have personally reviewed the radiological reports and agreed with the findings in the report.  ASSESSMENT AND PLAN:  Cancer of central portion of left female breast [facility]) This is a very pleasant 78 year old female patient with past medical history significant for right breast  high-grade invasive ductal carcinoma, ER/PR positive HER2 negative status postmastectomy followed by adjuvant chemotherapy.  She could not tolerate adjuvant endocrine therapy.  She then had a mammogram which showed a left breast mass.  She is to this postlumpectomy which showed apocrine invasive adenocarcinoma, grade 2, triple negative.  Proliferation index is at 5%. She then had left mastectomy. This showed no evidence of residual carcinoma. We discussed about considering TC every 3 weeks for 4 cycles given the lumpectomy pathology results. She declined chemo, now on observation alone.  Assessment and Plan    Breast Cancer Post bilateral mastectomy with dissatisfaction regarding the cosmetic outcome of the most recent surgery. No interest in reconstruction. No new symptoms suggestive of recurrence. -Continue observation. -Report any new symptoms promptly.  Arthritis Severe knee pain, not adequately controlled with Tylenol and ibuprofen.  -Consider another visit if pain remains uncontrolled.  General Health Maintenance -Plan to receive flu shot next week. -Follow-up in 6 months.    Rachel Moulds MD    Total time spent: 30 minutes including history, physical exam, review of records, counseling and coordination of care All questions were answered. The patient knows to call the clinic with any problems, questions or concerns.     Rachel Moulds, MD 01/26/24

## 2024-04-13 LAB — LIPID PANEL
Chol/HDL Ratio: 2.9 ratio (ref 0.0–4.4)
Cholesterol, Total: 198 mg/dL (ref 100–199)
HDL: 68 mg/dL (ref >39)
LDL Chol Calc (NIH): 112 mg/dL — ABNORMAL HIGH (ref 0–99)
Triglycerides: 103 mg/dL (ref 0–149)
VLDL Cholesterol Cal: 18 mg/dL (ref 5–40)

## 2024-04-13 LAB — T4, FREE: Free T4: 1.14 ng/dL (ref 0.82–1.77)

## 2024-04-13 LAB — TSH: TSH: 2.9 u[IU]/mL (ref 0.450–4.500)

## 2024-04-20 ENCOUNTER — Ambulatory Visit (INDEPENDENT_AMBULATORY_CARE_PROVIDER_SITE_OTHER): Payer: Medicare Other | Admitting: "Endocrinology

## 2024-04-20 ENCOUNTER — Encounter: Payer: Self-pay | Admitting: "Endocrinology

## 2024-04-20 VITALS — BP 128/64 | HR 56 | Ht 68.0 in | Wt 163.4 lb

## 2024-04-20 DIAGNOSIS — E042 Nontoxic multinodular goiter: Secondary | ICD-10-CM | POA: Diagnosis not present

## 2024-04-20 DIAGNOSIS — E782 Mixed hyperlipidemia: Secondary | ICD-10-CM

## 2024-04-20 DIAGNOSIS — E063 Autoimmune thyroiditis: Secondary | ICD-10-CM | POA: Diagnosis not present

## 2024-04-20 MED ORDER — LEVOTHYROXINE SODIUM 25 MCG PO TABS: 25.0000 ug | ORAL_TABLET | Freq: Every day | ORAL | 1 refills | Status: DC

## 2024-04-20 NOTE — Progress Notes (Signed)
 Endocrinology follow-up note                                             04/20/2024, 11:16 AM   Subjective:    Patient ID: Brittany Carr, female    DOB: 16-Dec-1945, PCP Lauran Pollard, MD   Past Medical History:  Diagnosis Date   Abnormal mammogram of left breast 08/16/2018   Arthritis    knees, hands (08/16/2018)   Cancer of central portion of right female breast (HCC) 08/16/2018   Chronic headaches    stress; weekly (08/16/2018)   GERD (gastroesophageal reflux disease)    History of kidney stones    Hypertension    Migraine    a couple/year (08/16/2018)   Past Surgical History:  Procedure Laterality Date   BREAST BIOPSY Bilateral 07/21/2018   BREAST BIOPSY Left 07/23/2018   BREAST BIOPSY Left 10/13/2022   US  LT RADIOACTIVE SEED LOC 10/13/2022 [facility] MAMMOGRAPHY   BREAST LUMPECTOMY Left 08/16/2018   LEFT BREAST LUMPECTOMY WITH RADIOACTIVE SEED LOCALIZATION   BREAST LUMPECTOMY WITH RADIOACTIVE SEED LOCALIZATION Left 08/16/2018   Procedure: LEFT BREAST LUMPECTOMY WITH RADIOACTIVE SEED LOCALIZATION;  Surgeon: Boyce Byes, MD;  [facility];  Service: General;  Laterality: Left;   BREAST LUMPECTOMY WITH RADIOACTIVE SEED LOCALIZATION Left 10/14/2022   Procedure: LEFT BREAST LUMPECTOMY WITH RADIOACTIVE SEED LOCALIZATION;  Surgeon: Oza Blumenthal, MD;  Location: [facility];  Service: General;  Laterality: Left;   CATARACT EXTRACTION W/ INTRAOCULAR LENS  IMPLANT, BILATERAL Bilateral    MASS EXCISION Left 10/14/2022   Procedure: EXCISION LEFT BACK MASS;  Surgeon: Oza Blumenthal, MD;  Location: [facility];  Service: General;  Laterality: Left;   MASTECTOMY Right 08/16/2018   WITH TARGETED AXILLARY LYMPH NODE DISSECTION   MASTECTOMY W/ SENTINEL NODE BIOPSY Left 12/10/2022   Procedure: LEFT MASTECTOMY WITH SENTINEL LYMPH NODE BIOPSY;  Surgeon: Oza Blumenthal, MD;  Location: [facility];  Service: General;   Laterality: Left;   MASTECTOMY WITH AXILLARY LYMPH NODE DISSECTION Right 08/16/2018   Procedure: RIGHT TOTAL MASTECTOMY WITH TARGETED AXILLARY LYMPH NODE DISSECTION;  Surgeon: Boyce Byes, MD;  [facility];  Service: General;  Laterality: Right;   PORT-A-CATH REMOVAL N/A 05/16/2019   Procedure: REMOVAL PORT-A-CATH;  Surgeon: Boyce Byes, MD;  Location: [facility];  Service: General;  Laterality: N/A;   PORTACATH PLACEMENT Right 10/04/2018   Procedure: INSERTION PORT-A-CATH;  Surgeon: Boyce Byes, MD;  Location: [facility];  Service: General;  Laterality: Right;   Social History   Socioeconomic History   Marital status: Single    Spouse name: Not on file   Number of children: Not on file   Years of education: Not on file   Highest education level: Not on file  Occupational History   Not on file  Tobacco Use   Smoking status: Never   Smokeless tobacco: Never  Vaping Use   Vaping status: Never Used  Substance and Sexual Activity   Alcohol  use: Never   Drug use: Never   Sexual activity: Not Currently  Other Topics Concern   Not on file  Social History Narrative   Has no one to drive her and stay with her DOS so she has to be [facility]   Social Drivers of Health   Financial Resource Strain: Low Risk  (  12/04/2022)   Overall Financial Resource Strain (CARDIA)    Difficulty of Paying Living Expenses: Not very hard  Food Insecurity: No Food Insecurity (12/04/2022)   Hunger Vital Sign    Worried About Running Out of Food in the Last Year: Never true    Ran Out of Food in the Last Year: Never true  Transportation Needs: No Transportation Needs (12/04/2022)   PRAPARE - Administrator, Civil Service (Medical): No    Lack of Transportation (Non-Medical): No  Physical Activity: Sufficiently Active (08/24/2018)   Received from [facility], [facility]   Exercise Vital Sign    Days of Exercise per Week: 7 days    Minutes  of Exercise per Session: 30 min  Stress: No Stress Concern Present (04/22/2021)   Received from [facility], [facility] of Occupational Health - Occupational Stress Questionnaire    Feeling of Stress : Not at all  Social Connections: Socially Isolated (08/24/2018)   Received from [facility], [facility]   Social Connection and Isolation Panel [NHANES]    Frequency of Communication with Friends and Family: More than three times a week    Frequency of Social Gatherings with Friends and Family: Three times a week    Attends Religious Services: Never    Active Member of Clubs or Organizations: No    Attends Banker Meetings: Never    Marital Status: Never married   Family History  Problem Relation Age of Onset   Hypertension Mother    Diabetes Mother    Osteoporosis Mother    Hypertension Father    Diabetes Father    Stroke Father    Outpatient Encounter Medications as of 04/20/2024  Medication Sig   Cholecalciferol (VITAMIN D-3) 25 MCG (1000 UT) CAPS Take by mouth.   cyanocobalamin (VITAMIN B12) 250 MCG tablet Take 250 mcg by mouth daily.   famotidine (PEPCID) 40 MG tablet Take 40 mg by mouth daily.   latanoprost  (XALATAN ) 0.005 % ophthalmic solution Place 1 drop into the right eye at bedtime.   levothyroxine  (SYNTHROID ) 25 MCG tablet Take 1 tablet (25 mcg total) by mouth daily before breakfast.   lisinopril  (PRINIVIL ,ZESTRIL ) 20 MG tablet Take 40 mg by mouth daily.   Multiple Vitamin (MULTIVITAMIN WITH MINERALS) TABS tablet Take 2 tablets by mouth daily. One-A-Day Proactive 65+   psyllium (METAMUCIL) 58.6 % packet Take 1 packet by mouth daily.   UNABLE TO FIND daily as needed. Med Name: Ivizia eye drops   [DISCONTINUED] levothyroxine  (SYNTHROID ) 25 MCG tablet Take 1 tablet (25 mcg total) by mouth daily before breakfast.   No facility-administered encounter medications on file as of 04/20/2024.   ALLERGIES: Allergies  Allergen  Reactions   Sulfa Antibiotics Other (See Comments)    make me feel weird , did not work    VACCINATION STATUS: Immunization History  Administered Date(s) Administered   Influenza-Unspecified 08/06/2018   Moderna Sars-Covid-2 Vaccination 01/05/2020, 01/30/2020, 10/22/2020, 04/22/2021    HPI Brittany Carr is 78 y.o. female who presents today with a medical history as above. she is being seen in follow-up after she was seen consultation for history of multinodular goiter requested by Lauran Pollard, MD.    Patient is known to have multinodular goiter from previous imaging studies.  She did undergo fine-needle aspiration of right lobe nodule in December 2022 with benign findings.  Her most recent thyroid  ultrasound was in October 2023 showing  stable findings.  She is on low-dose levothyroxine  25 mg p.o. daily for mild hypothyroidism.  She reports compliance, her previsit thyroid  function tests are consistent with appropriate replacement.     She does not have new or recent complaints or thyroid  concerns.  She denies family history of thyroid  malignancy. She does not have recent thyroid  function test. She denies dysphagia, shortness breath, nor voice change.  She denies palpitations, tremors, nor heat/cold intolerance. Her labs are also showing hyperlipidemia.  She is not on treatment for hyperlipidemia.  She has other chronic problems including hypertension, history of breast cancer.  Review of Systems  Constitutional: + Mildly fluctuating body weight,  no fatigue, no subjective hyperthermia, no subjective hypothermia Eyes: no blurry vision, no xerophthalmia ENT: no sore throat, no nodules palpated in throat, no dysphagia/odynophagia, no hoarseness  Objective:       04/20/2024   10:18 AM 01/26/2024    1:32 PM 10/21/2023    1:34 PM  Vitals with BMI  Height 5' 8    Weight 163 lbs 6 oz 165 lbs 2 oz   BMI 24.85    Systolic 128 140 409  Diastolic 64 82 74  Pulse 56 74      BP 128/64   Pulse (!) 56   Ht 5' 8 (1.727 m)   Wt 163 lb 6.4 oz (74.1 kg)   BMI 24.84 kg/m   Wt Readings from Last 3 Encounters:  04/20/24 163 lb 6.4 oz (74.1 kg)  01/26/24 165 lb 1.6 oz (74.9 kg)  10/21/23 163 lb 6.4 oz (74.1 kg)    Physical Exam  Constitutional:  Body mass index is 24.84 kg/m.,  not in acute distress, normal state of mind Eyes: PERRLA, EOMI, no exophthalmos ENT: moist mucous membranes, no gross thyromegaly, no gross cervical lymphadenopathy Cardiovascular: normal precordial activity, Regular Rate and Rhythm, no Murmur/Rubs/Gallops   CMP ( most recent) CMP     Component Value Date/Time   NA 137 09/29/2018 1507   K 4.0 09/29/2018 1507   CL 104 09/29/2018 1507   CO2 26 09/29/2018 1507   GLUCOSE 95 09/29/2018 1507   BUN 19 09/29/2018 1507   CREATININE 0.78 09/29/2018 1507   CALCIUM  9.6 09/29/2018 1507   PROT 7.3 09/29/2018 1507   ALBUMIN 3.9 09/29/2018 1507   AST 23 09/29/2018 1507   ALT 22 09/29/2018 1507   ALKPHOS 78 09/29/2018 1507   BILITOT 0.6 09/29/2018 1507   GFRNONAA >60 09/29/2018 1507   GFRAA >60 09/29/2018 1507   Thyroid  ultrasound on August 23, 2022 right lobe 3.9 cm, left lobe 4.7 cm.  She had left lobe nodule measuring 1.0 cm, benign features.  Right lobe nodule measuring 1.9 cm, previously biopsied, stable in size.  Recent Results (from the past 2160 hours)  TSH     Status: None   Collection Time: 04/12/24 10:55 AM  Result Value Ref Range   TSH 2.900 0.450 - 4.500 uIU/mL  T4, free     Status: None   Collection Time: 04/12/24 10:55 AM  Result Value Ref Range   Free T4 1.14 0.82 - 1.77 ng/dL  Lipid panel     Status: Abnormal   Collection Time: 04/12/24 10:55 AM  Result Value Ref Range   Cholesterol, Total 198 100 - 199 mg/dL   Triglycerides 811 0 - 149 mg/dL   HDL 68 >91 mg/dL   VLDL Cholesterol Cal 18 5 - 40 mg/dL   LDL Chol Calc (NIH) 478 (H) 0 - 99  mg/dL   Chol/HDL Ratio 2.9 0.0 - 4.4 ratio    Comment:                                    T. Chol/HDL Ratio                                             Men  Women                               1/2 Avg.Risk  3.4    3.3                                   Avg.Risk  5.0    4.4                                2X Avg.Risk  9.6    7.1                                3X Avg.Risk 23.4   11.0        Assessment & Plan:   1. Acquired hypothyroidism 2. Multiple thyroid  nodules 3.  Hyperlipidemia  - I have reviewed her new and available thyroid  records and clinically evaluated the patient. - Based on these reviews, she has benign multinodular goiter, with recent imaging showing stable findings in October 2023.  She will not need intervention for this at this time.   Her previsit thyroid  function tests are consistent with appropriate replacement.  She is advised to continue levothyroxine  25 mcg p.o. daily before breakfast.     - We discussed about the correct intake of her thyroid  hormone, on empty stomach at fasting, with water, separated by at least 30 minutes from breakfast and other medications,  and separated by more than 4 hours from calcium , iron, multivitamins, acid reflux medications (PPIs). -Patient is made aware of the fact that thyroid  hormone replacement is needed for life, dose to be adjusted by periodic monitoring of thyroid  function tests.  Regarding her hyperlipidemia, she wishes to delay initiation of statins.  Whole food plan based diet was discussed and recommended to her.   - she is advised to maintain close follow up with Lauran Pollard, MD for primary care needs.   I spent  22  minutes in the care of the patient today including review of labs from Thyroid  Function, CMP, and other relevant labs ; imaging/biopsy records (current and previous including abstractions from other facilities); face-to-face time discussing  her lab results and symptoms, medications doses, her options of short and long term treatment based on the latest standards of care  / guidelines;   and documenting the encounter.  [employer]  participated in the discussions, expressed understanding, and voiced agreement with the above plans.  All questions were answered to her satisfaction. she is encouraged to contact clinic should she have any questions or concerns prior to her return visit.    Follow up plan: Return in about 6 months (around 10/20/2024) for F/U with Pre-visit Labs.  Kalvin Orf, MD [facility] [address] [place], [place] [address] Phone: [phone]  Fax: [phone]     04/20/2024, 11:16 AM  This note was partially dictated with voice recognition software. Similar sounding words can be transcribed inadequately or may not  be corrected upon review.

## 2024-10-12 LAB — T4, FREE: Free T4: 1.26 ng/dL (ref 0.82–1.77)

## 2024-10-12 LAB — TSH: TSH: 3.65 u[IU]/mL (ref 0.450–4.500)

## 2024-10-20 ENCOUNTER — Ambulatory Visit: Admitting: "Endocrinology

## 2024-10-20 ENCOUNTER — Encounter: Payer: Self-pay | Admitting: "Endocrinology

## 2024-10-20 VITALS — BP 142/78 | HR 72 | Ht 68.0 in | Wt 165.6 lb

## 2024-10-20 DIAGNOSIS — E042 Nontoxic multinodular goiter: Secondary | ICD-10-CM | POA: Diagnosis not present

## 2024-10-20 DIAGNOSIS — E063 Autoimmune thyroiditis: Secondary | ICD-10-CM | POA: Diagnosis not present

## 2024-10-20 MED ORDER — LEVOTHYROXINE SODIUM 25 MCG PO TABS: 25.0000 ug | ORAL_TABLET | Freq: Every day | ORAL | 1 refills | Status: AC

## 2024-10-20 NOTE — Progress Notes (Signed)
 Endocrinology follow-up note                                             10/20/2024, 2:27 PM   Subjective:    Patient ID: Brittany Carr, female    DOB: Jun 21, 1946, PCP Trudy Vaughn FALCON, MD   Past Medical History:  Diagnosis Date   Abnormal mammogram of left breast 08/16/2018   Arthritis    knees, hands (08/16/2018)   Cancer of central portion of right female breast (HCC) 08/16/2018   Chronic headaches    stress; weekly (08/16/2018)   GERD (gastroesophageal reflux disease)    History of kidney stones    Hypertension    Migraine    a couple/year (08/16/2018)   Past Surgical History:  Procedure Laterality Date   BREAST BIOPSY Bilateral 07/21/2018   BREAST BIOPSY Left 07/23/2018   BREAST BIOPSY Left 10/13/2022   US  LT RADIOACTIVE SEED LOC 10/13/2022 [facility]   BREAST LUMPECTOMY Left 08/16/2018   LEFT BREAST LUMPECTOMY WITH RADIOACTIVE SEED LOCALIZATION   BREAST LUMPECTOMY WITH RADIOACTIVE SEED LOCALIZATION Left 08/16/2018   Procedure: LEFT BREAST LUMPECTOMY WITH RADIOACTIVE SEED LOCALIZATION;  Surgeon: Gail Favorite, MD;  Location: [facility];  Service: General;  Laterality: Left;   BREAST LUMPECTOMY WITH RADIOACTIVE SEED LOCALIZATION Left 10/14/2022   Procedure: LEFT BREAST LUMPECTOMY WITH RADIOACTIVE SEED LOCALIZATION;  Surgeon: Vernetta Berg, MD;  Location: [facility];  Service: General;  Laterality: Left;   CATARACT EXTRACTION W/ INTRAOCULAR LENS  IMPLANT, BILATERAL Bilateral    MASS EXCISION Left 10/14/2022   Procedure: EXCISION LEFT BACK MASS;  Surgeon: Vernetta Berg, MD;  Location: [facility];  Service: General;  Laterality: Left;   MASTECTOMY Right 08/16/2018   WITH TARGETED AXILLARY LYMPH NODE DISSECTION   MASTECTOMY W/ SENTINEL NODE BIOPSY Left 12/10/2022   Procedure: LEFT MASTECTOMY WITH SENTINEL LYMPH NODE BIOPSY;  Surgeon: Vernetta Berg, MD;  Location: [facility];  Service: General;   Laterality: Left;   MASTECTOMY WITH AXILLARY LYMPH NODE DISSECTION Right 08/16/2018   Procedure: RIGHT TOTAL MASTECTOMY WITH TARGETED AXILLARY LYMPH NODE DISSECTION;  Surgeon: Gail Favorite, MD;  Location: [facility];  Service: General;  Laterality: Right;   PORT-A-CATH REMOVAL N/A 05/16/2019   Procedure: REMOVAL PORT-A-CATH;  Surgeon: Gail Favorite, MD;  Location: [facility];  Service: General;  Laterality: N/A;   PORTACATH PLACEMENT Right 10/04/2018   Procedure: INSERTION PORT-A-CATH;  Surgeon: Gail Favorite, MD;  Location: [facility];  Service: General;  Laterality: Right;   Social History   Socioeconomic History   Marital status: Single    Spouse name: Not on file   Number of children: Not on file   Years of education: Not on file   Highest education level: Not on file  Occupational History   Not on file  Tobacco Use   Smoking status: Never   Smokeless tobacco: Never  Vaping Use   Vaping status: Never Used  Substance and Sexual Activity   Alcohol  use: Never   Drug use: Never   Sexual activity: Not Currently  Other Topics Concern   Not on file  Social History Narrative   Has no one to drive her and stay with her DOS so she has to be [facility]   Social Drivers of Health   Tobacco Use: Low Risk (10/20/2024)  Patient History    Smoking Tobacco Use: Never    Smokeless Tobacco Use: Never    Passive Exposure: Not on file  Financial Resource Strain: Low Risk (12/04/2022)   Overall Financial Resource Strain (CARDIA)    Difficulty of Paying Living Expenses: Not very hard  Food Insecurity: No Food Insecurity (12/04/2022)   Hunger Vital Sign    Worried About Running Out of Food in the Last Year: Never true    Ran Out of Food in the Last Year: Never true  Transportation Needs: No Transportation Needs (12/04/2022)   PRAPARE - Administrator, Civil Service (Medical): No    Lack of Transportation (Non-Medical): No  Physical Activity: Not  on file  Stress: Not on file  Social Connections: Not on file  Depression (EYV7-0): Not on file  Alcohol  Screen: Not on file  Housing: Low Risk (12/04/2022)   Housing    Last Housing Risk Score: 0  Utilities: Not At Risk (12/04/2022)   AHC Utilities    Threatened with loss of utilities: No  Health Literacy: Not on file   Family History  Problem Relation Age of Onset   Hypertension Mother    Diabetes Mother    Osteoporosis Mother    Hypertension Father    Diabetes Father    Stroke Father    Outpatient Encounter Medications as of 10/20/2024  Medication Sig   Cholecalciferol (VITAMIN D-3) 25 MCG (1000 UT) CAPS Take by mouth.   cyanocobalamin (VITAMIN B12) 250 MCG tablet Take 250 mcg by mouth daily.   famotidine (PEPCID) 40 MG tablet Take 40 mg by mouth daily.   latanoprost  (XALATAN ) 0.005 % ophthalmic solution Place 1 drop into the right eye at bedtime.   levothyroxine  (SYNTHROID ) 25 MCG tablet Take 1 tablet (25 mcg total) by mouth daily before breakfast.   lisinopril  (PRINIVIL ,ZESTRIL ) 20 MG tablet Take 40 mg by mouth daily.   Multiple Vitamin (MULTIVITAMIN WITH MINERALS) TABS tablet Take 2 tablets by mouth daily. One-A-Day Proactive 65+   psyllium (METAMUCIL) 58.6 % packet Take 1 packet by mouth daily.   UNABLE TO FIND daily as needed. Med Name: Ivizia eye drops   [DISCONTINUED] levothyroxine  (SYNTHROID ) 25 MCG tablet Take 1 tablet (25 mcg total) by mouth daily before breakfast.   No facility-administered encounter medications on file as of 10/20/2024.   ALLERGIES: Allergies  Allergen Reactions   Sulfa Antibiotics Other (See Comments)    make me feel weird , did not work    VACCINATION STATUS: Immunization History  Administered Date(s) Administered   Influenza-Unspecified 08/06/2018   Moderna Sars-Covid-2 Vaccination 01/05/2020, 01/30/2020, 10/22/2020, 04/22/2021    HPI Brittany Carr is 78 y.o. female who presents today with a medical history as above. she is being  seen in follow-up after she was seen consultation for history of multinodular goiter requested by Trudy Vaughn FALCON, MD.    Patient is known to have multinodular goiter from previous imaging studies.  She did undergo fine-needle aspiration of right lobe nodule in December 2022 with benign findings.  Her most recent thyroid  ultrasound was in October 2023 showing stable findings.  She is on low-dose levothyroxine  25 mcg p.o. daily before breakfast.  Her previsit thyroid  function tests are consistent with appropriate replacement.   She does not have new or recent complaints or thyroid  concerns.  She denies family history of thyroid  malignancy. She does not have recent thyroid  function test. She denies dysphagia, shortness breath, nor voice change.  She denies palpitations, tremors,  nor heat/cold intolerance. Her labs are also showing hyperlipidemia.  She is not on treatment for hyperlipidemia.  She has other chronic problems including hypertension, history of breast cancer.  Review of Systems  Constitutional: + Mildly fluctuating body weight,  no fatigue, no subjective hyperthermia, no subjective hypothermia Eyes: no blurry vision, no xerophthalmia ENT: no sore throat, no nodules palpated in throat, no dysphagia/odynophagia, no hoarseness  Objective:       10/20/2024   10:21 AM 04/20/2024   10:18 AM 01/26/2024    1:32 PM  Vitals with BMI  Height 5' 8 5' 8   Weight 165 lbs 10 oz 163 lbs 6 oz 165 lbs 2 oz  BMI 25.19 24.85   Systolic 142 128 859  Diastolic 78 64 82  Pulse 72 56 74    BP (!) 142/78   Pulse 72   Ht 5' 8 (1.727 m)   Wt 165 lb 9.6 oz (75.1 kg)   BMI 25.18 kg/m   Wt Readings from Last 3 Encounters:  10/20/24 165 lb 9.6 oz (75.1 kg)  04/20/24 163 lb 6.4 oz (74.1 kg)  01/26/24 165 lb 1.6 oz (74.9 kg)    Physical Exam  Constitutional:  Body mass index is 25.18 kg/m.,  not in acute distress, normal state of mind Eyes: PERRLA, EOMI, no exophthalmos ENT: moist mucous  membranes, no gross thyromegaly, no gross cervical lymphadenopathy   CMP ( most recent) CMP     Component Value Date/Time   NA 137 09/29/2018 1507   K 4.0 09/29/2018 1507   CL 104 09/29/2018 1507   CO2 26 09/29/2018 1507   GLUCOSE 95 09/29/2018 1507   BUN 19 09/29/2018 1507   CREATININE 0.78 09/29/2018 1507   CALCIUM  9.6 09/29/2018 1507   PROT 7.3 09/29/2018 1507   ALBUMIN 3.9 09/29/2018 1507   AST 23 09/29/2018 1507   ALT 22 09/29/2018 1507   ALKPHOS 78 09/29/2018 1507   BILITOT 0.6 09/29/2018 1507   GFRNONAA >60 09/29/2018 1507   GFRAA >60 09/29/2018 1507   Thyroid  ultrasound on August 23, 2022 right lobe 3.9 cm, left lobe 4.7 cm.  She had left lobe nodule measuring 1.0 cm, benign features.  Right lobe nodule measuring 1.9 cm, previously biopsied, stable in size.  Recent Results (from the past 2160 hours)  TSH     Status: None   Collection Time: 10/11/24 10:31 AM  Result Value Ref Range   TSH 3.650 0.450 - 4.500 uIU/mL  T4, free     Status: None   Collection Time: 10/11/24 10:31 AM  Result Value Ref Range   Free T4 1.26 0.82 - 1.77 ng/dL       Assessment & Plan:   1. Acquired hypothyroidism 2. Multiple thyroid  nodules 3.  Hyperlipidemia  - I have reviewed her new and available thyroid  records and clinically evaluated the patient. - Based on these reviews, she has benign multinodular goiter, with recent imaging showing stable findings in October 2023.  She will not need intervention for this at this time.   Her previsit thyroid  function tests are consistent with appropriate replacement.  She is advised to continue levothyroxine  25 mcg p.o. daily before breakfast.   - We discussed about the correct intake of her thyroid  hormone, on empty stomach at fasting, with water, separated by at least 30 minutes from breakfast and other medications,  and separated by more than 4 hours from calcium , iron, multivitamins, acid reflux medications (PPIs). -Patient is made aware  of  the fact that thyroid  hormone replacement is needed for life, dose to be adjusted by periodic monitoring of thyroid  function tests.   Regarding her hyperlipidemia, she wishes to delay initiation of statins.  Whole food plan based diet was discussed and recommended to her.   - she is advised to maintain close follow up with Trudy Vaughn FALCON, MD for primary care needs.   I spent  20  minutes in the care of the patient today including review of labs from Thyroid  Function, CMP, and other relevant labs ; imaging/biopsy records (current and previous including abstractions from other facilities); face-to-face time discussing  her lab results and symptoms, medications doses, her options of short and long term treatment based on the latest standards of care / guidelines;   and documenting the encounter.  [employer]  participated in the discussions, expressed understanding, and voiced agreement with the above plans.  All questions were answered to her satisfaction. she is encouraged to contact clinic should she have any questions or concerns prior to her return visit.   Follow up plan: Return in about 6 months (around 04/20/2025) for Fasting Labs  in AM B4 8.   Ranny Earl, MD [facility] [address] [place], [place] [address] Phone: [phone]  Fax: [phone]     10/20/2024, 2:27 PM  This note was partially dictated with voice recognition software. Similar sounding words can be transcribed inadequately or may not  be corrected upon review.

## 2025-01-26 ENCOUNTER — Ambulatory Visit: Admitting: Hematology and Oncology

## 2025-04-21 ENCOUNTER — Ambulatory Visit: Admitting: "Endocrinology
